# Patient Record
Sex: Male | Born: 1975 | Race: White | Hispanic: No | Marital: Married | Smoking: Never smoker
Health system: Southern US, Community
[De-identification: ages and names within clinical notes are randomized; demographics above are authoritative.]

## PROBLEM LIST (undated history)

## (undated) DIAGNOSIS — M5136 Other intervertebral disc degeneration, lumbar region: Secondary | ICD-10-CM

## (undated) DIAGNOSIS — F419 Anxiety disorder, unspecified: Secondary | ICD-10-CM

## (undated) DIAGNOSIS — N189 Chronic kidney disease, unspecified: Secondary | ICD-10-CM

## (undated) DIAGNOSIS — M1990 Unspecified osteoarthritis, unspecified site: Secondary | ICD-10-CM

## (undated) DIAGNOSIS — G63 Polyneuropathy in diseases classified elsewhere: Secondary | ICD-10-CM

## (undated) DIAGNOSIS — M51369 Other intervertebral disc degeneration, lumbar region without mention of lumbar back pain or lower extremity pain: Secondary | ICD-10-CM

## (undated) DIAGNOSIS — K219 Gastro-esophageal reflux disease without esophagitis: Secondary | ICD-10-CM

## (undated) DIAGNOSIS — J4550 Severe persistent asthma, uncomplicated: Secondary | ICD-10-CM

## (undated) DIAGNOSIS — K649 Unspecified hemorrhoids: Secondary | ICD-10-CM

## (undated) DIAGNOSIS — Z9889 Other specified postprocedural states: Secondary | ICD-10-CM

## (undated) DIAGNOSIS — R7303 Prediabetes: Secondary | ICD-10-CM

## (undated) DIAGNOSIS — J189 Pneumonia, unspecified organism: Secondary | ICD-10-CM

## (undated) DIAGNOSIS — H5010 Unspecified exotropia: Secondary | ICD-10-CM

## (undated) DIAGNOSIS — R42 Dizziness and giddiness: Secondary | ICD-10-CM

## (undated) DIAGNOSIS — K2270 Barrett's esophagus without dysplasia: Secondary | ICD-10-CM

## (undated) DIAGNOSIS — E559 Vitamin D deficiency, unspecified: Secondary | ICD-10-CM

## (undated) DIAGNOSIS — Z8719 Personal history of other diseases of the digestive system: Secondary | ICD-10-CM

## (undated) DIAGNOSIS — R600 Localized edema: Secondary | ICD-10-CM

## (undated) DIAGNOSIS — N200 Calculus of kidney: Secondary | ICD-10-CM

## (undated) DIAGNOSIS — G43909 Migraine, unspecified, not intractable, without status migrainosus: Secondary | ICD-10-CM

## (undated) DIAGNOSIS — E785 Hyperlipidemia, unspecified: Secondary | ICD-10-CM

## (undated) DIAGNOSIS — Z87442 Personal history of urinary calculi: Secondary | ICD-10-CM

## (undated) HISTORY — PX: MYRINGOTOMY WITH TUBE PLACEMENT: SHX5663

## (undated) HISTORY — PX: TONSILLECTOMY: SUR1361

## (undated) HISTORY — DX: Calculus of kidney: N20.0

---

## 2003-09-13 HISTORY — PX: STRABISMUS SURGERY: SHX218

## 2004-05-07 ENCOUNTER — Other Ambulatory Visit: Payer: Self-pay

## 2005-07-05 ENCOUNTER — Ambulatory Visit: Payer: Self-pay

## 2006-04-04 ENCOUNTER — Ambulatory Visit: Payer: Self-pay | Admitting: Internal Medicine

## 2006-08-30 ENCOUNTER — Encounter: Payer: Self-pay | Admitting: Unknown Physician Specialty

## 2006-09-12 ENCOUNTER — Encounter: Payer: Self-pay | Admitting: Unknown Physician Specialty

## 2006-10-13 ENCOUNTER — Encounter: Payer: Self-pay | Admitting: Unknown Physician Specialty

## 2006-11-09 ENCOUNTER — Ambulatory Visit: Payer: Self-pay | Admitting: Psychiatry

## 2006-11-24 ENCOUNTER — Ambulatory Visit: Payer: Self-pay | Admitting: Psychiatry

## 2006-12-20 ENCOUNTER — Ambulatory Visit: Payer: Self-pay | Admitting: Internal Medicine

## 2007-01-27 ENCOUNTER — Ambulatory Visit: Payer: Self-pay | Admitting: Internal Medicine

## 2007-04-27 ENCOUNTER — Ambulatory Visit: Payer: Self-pay | Admitting: Internal Medicine

## 2007-05-07 ENCOUNTER — Encounter: Payer: Self-pay | Admitting: Internal Medicine

## 2007-05-14 ENCOUNTER — Encounter: Payer: Self-pay | Admitting: Internal Medicine

## 2007-05-29 ENCOUNTER — Emergency Department: Payer: Self-pay | Admitting: Emergency Medicine

## 2007-06-05 ENCOUNTER — Ambulatory Visit: Payer: Self-pay | Admitting: Internal Medicine

## 2007-06-13 ENCOUNTER — Encounter: Payer: Self-pay | Admitting: Internal Medicine

## 2007-07-09 ENCOUNTER — Ambulatory Visit: Payer: Self-pay | Admitting: Internal Medicine

## 2008-03-31 ENCOUNTER — Encounter: Payer: Self-pay | Admitting: Internal Medicine

## 2008-04-12 ENCOUNTER — Encounter: Payer: Self-pay | Admitting: Internal Medicine

## 2008-05-13 ENCOUNTER — Encounter: Payer: Self-pay | Admitting: Internal Medicine

## 2008-06-12 ENCOUNTER — Encounter: Payer: Self-pay | Admitting: Internal Medicine

## 2008-10-06 ENCOUNTER — Ambulatory Visit: Payer: Self-pay | Admitting: Physician Assistant

## 2008-12-24 ENCOUNTER — Ambulatory Visit: Payer: Self-pay | Admitting: Internal Medicine

## 2009-04-09 ENCOUNTER — Ambulatory Visit: Payer: Self-pay | Admitting: Urology

## 2010-08-30 ENCOUNTER — Ambulatory Visit: Payer: Self-pay | Admitting: Internal Medicine

## 2010-09-12 HISTORY — PX: URETEROSCOPY WITH HOLMIUM LASER LITHOTRIPSY: SHX6645

## 2010-09-27 ENCOUNTER — Ambulatory Visit: Payer: Self-pay | Admitting: Internal Medicine

## 2010-09-28 ENCOUNTER — Ambulatory Visit: Payer: Self-pay | Admitting: Urology

## 2011-08-05 IMAGING — CT CT STONE STUDY
1 of 2 series · 15 of 32 positions shown, 19 images · non-contrast
Comparison: none

REASON FOR EXAM: CR  [id number]  progressive flank pain eval stone
COMMENTS:

PROCEDURE:     CT  - CT ABDOMEN /PELVIS WO (STONE)  - September 27, 2010 [DATE]
RESULT:     CT abdomen pelvis dated 09/27/2010 comparison is to prior study
dated 12/24/2008.
TECHNIQUE: Helical noncontrasted 3 mm sections were obtained from the lung
bases to the pubic symphysis.

[Series 2: stone · axial · 0.82mm/px · z∈[-14,+462]mm · 15 of 180 slices shown, 19 images]
[im 14/180  soft-tissue]
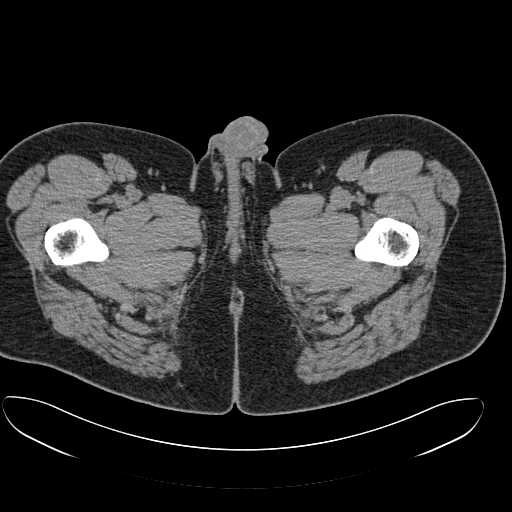
[im 14/180  bone]
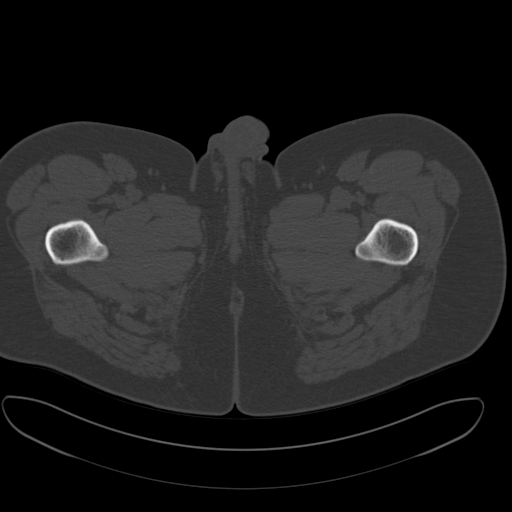
[im 27/180  soft-tissue]
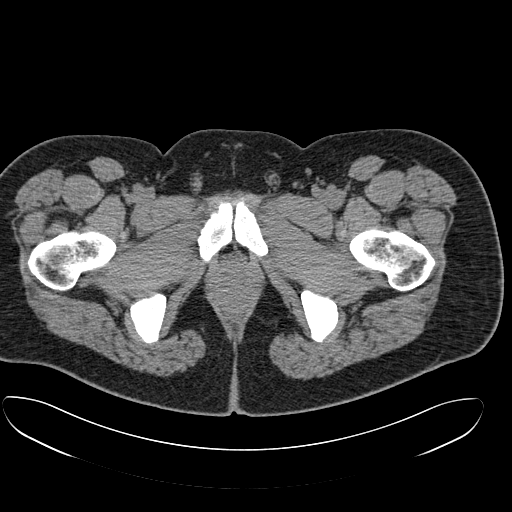
[im 40/180  soft-tissue]
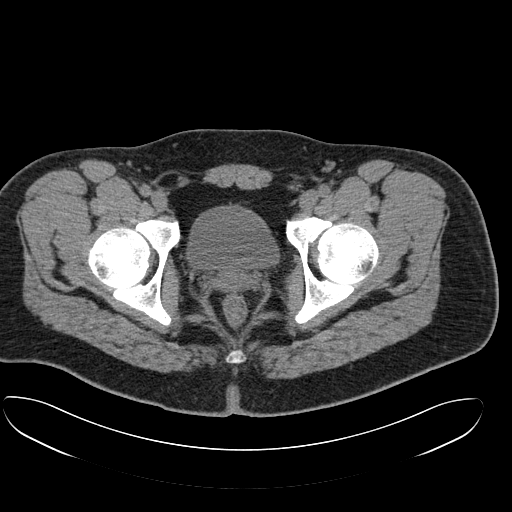
[im 54/180  soft-tissue]
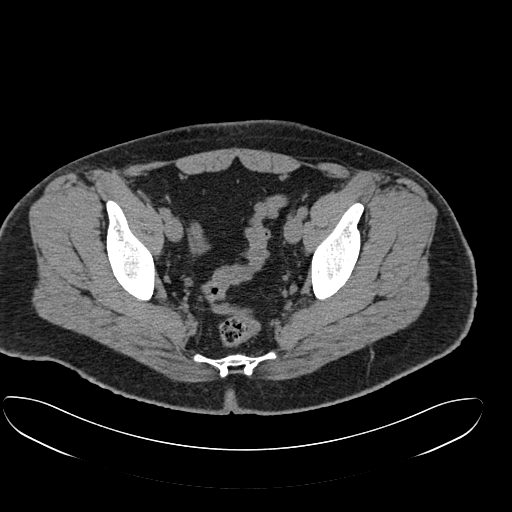
[im 67/180  soft-tissue]
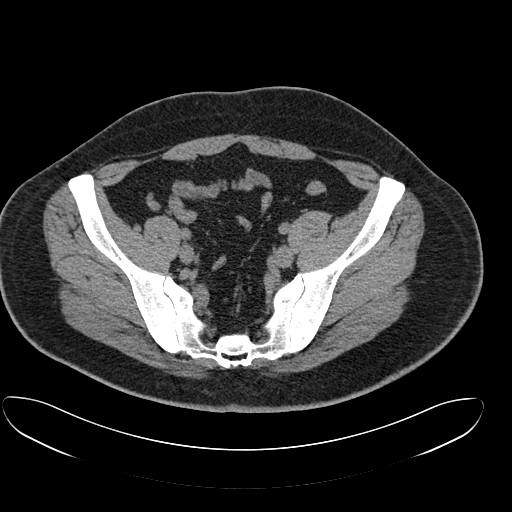
[im 80/180  soft-tissue]
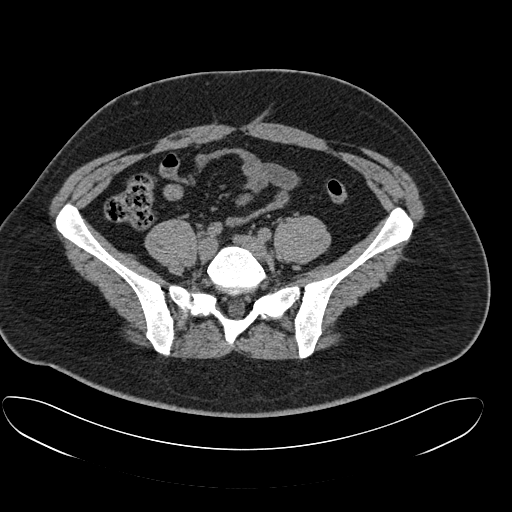
[im 93/180  soft-tissue]
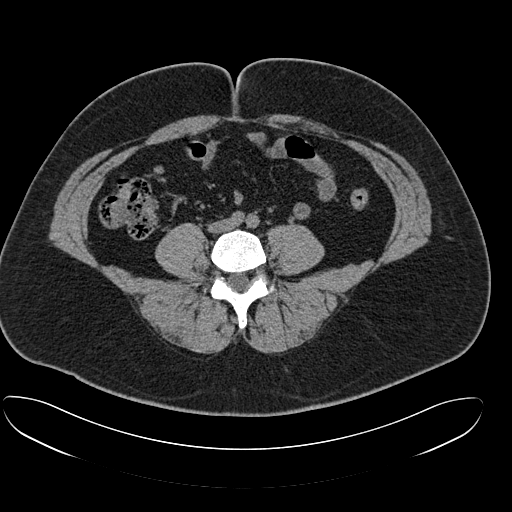
[im 107/180  soft-tissue]
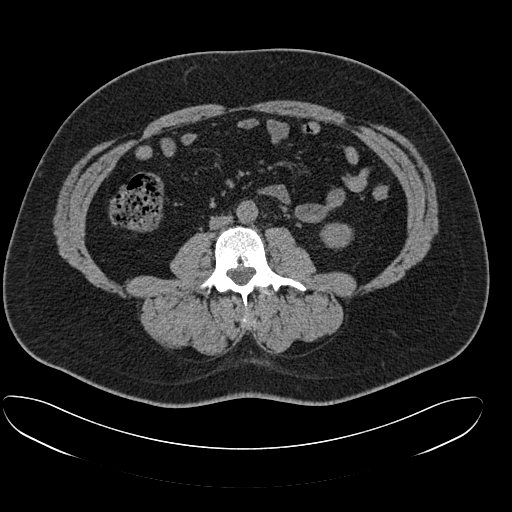
[im 120/180  soft-tissue]
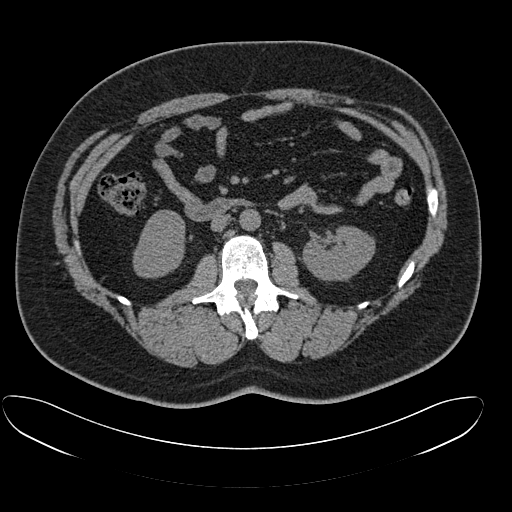
[im 120/180  bone]
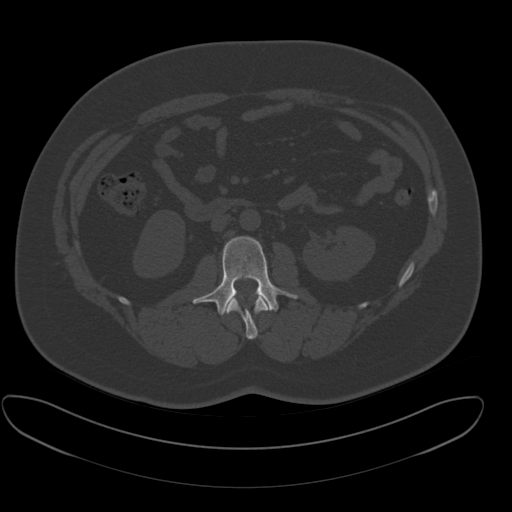
[im 133/180  soft-tissue]
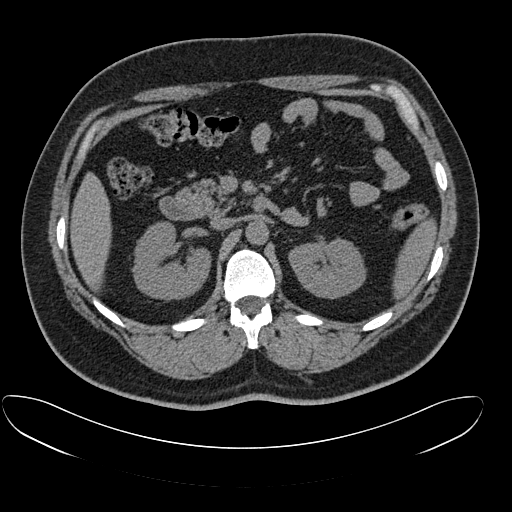
[im 146/180  soft-tissue]
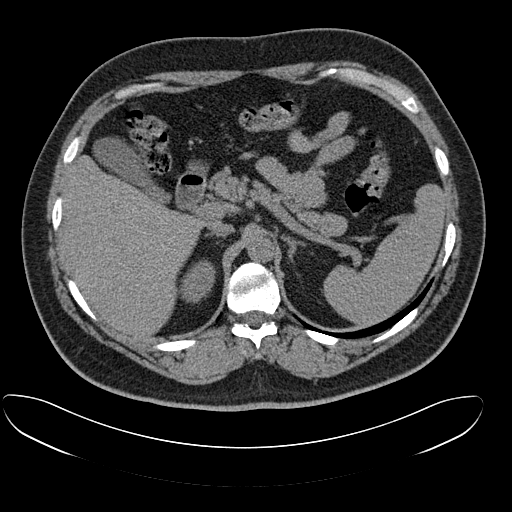
[im 153/180  lung]
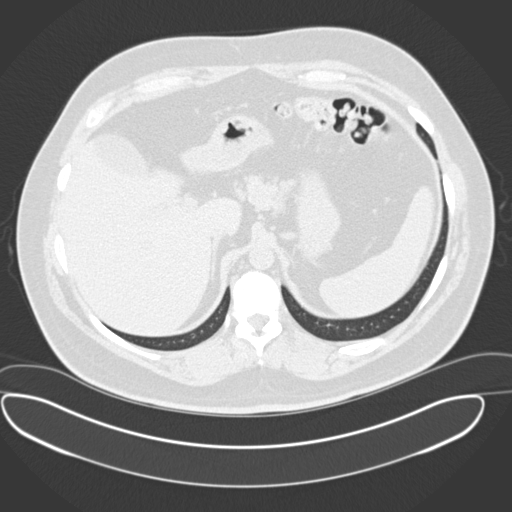
[im 160/180  soft-tissue]
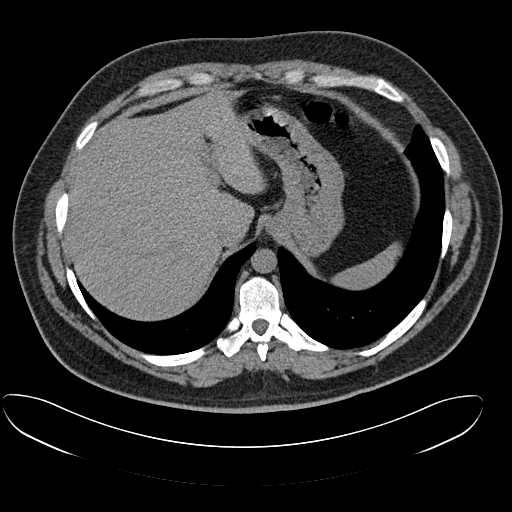
[im 160/180  lung]
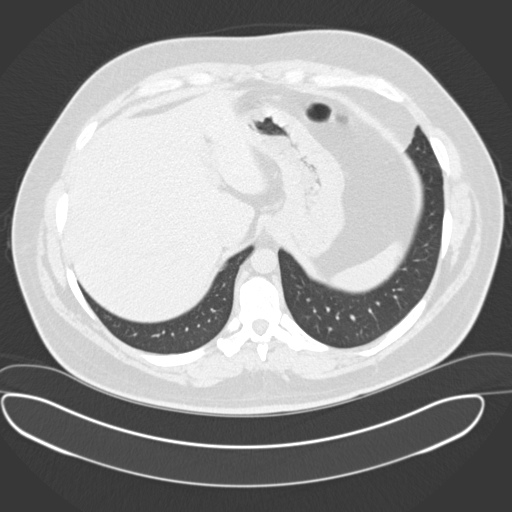
[im 166/180  lung]
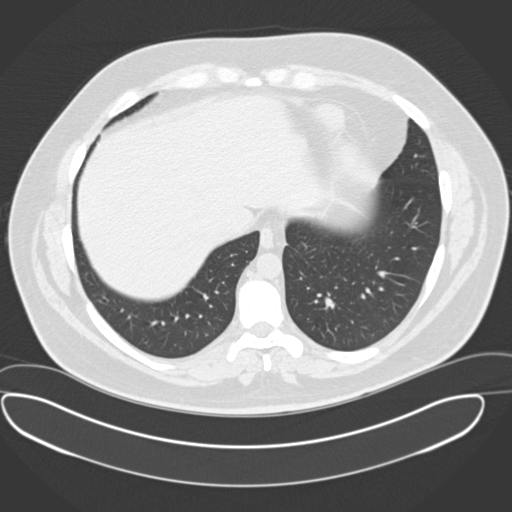
[im 173/180  soft-tissue]
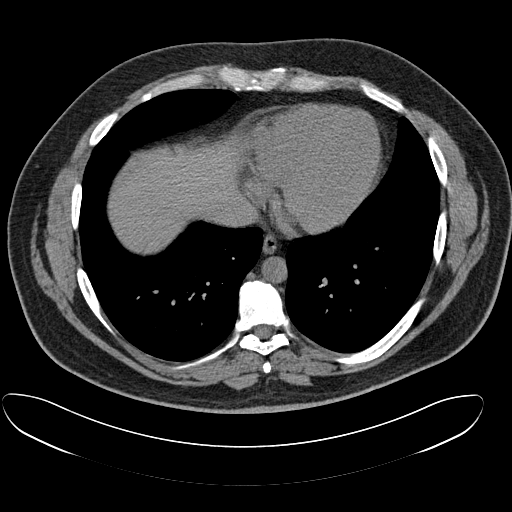
[im 173/180  lung]
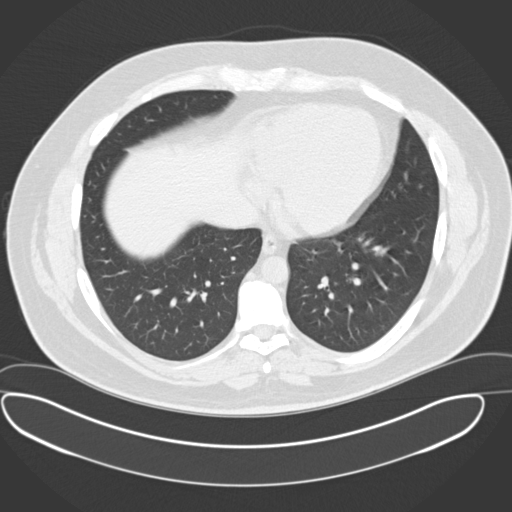

[15 of 32 positions shown; findings below may reference images not displayed]

FINDINGS: Evaluation lung bases demonstrates no gross abnormalities.
Noncontrast evaluation of the liver, spleen, adrenals, pancreas is
unremarkable.

The right kidney demonstrates no evidence of hydronephrosis nor evidence of
hydroureter. Within the distal right ureter at the level of the
vesico-ureteral junction a 3.8 mm calculus is identified. Evaluation of left
kidney demonstrates no evidence of hydronephrosis nor hydroureter. Within
the distal left ureter 3 calculi are appreciated the largest is just
proximal to the vesico-ureteral junction and measures 6.9 mm in diameter.
There is no evidence of obstructive uropathy. The limitations of noncontrast
CT there is no evidence of bowel obstruction, enteritis, colitis,
diverticulitis, nor appendicitis. There is no evidence of abdominal aortic
aneurysm.
IMPRESSION: Bilateral distal ureteral calculi as described above with
no evidence of obstructive uropathy.

## 2011-08-13 ENCOUNTER — Ambulatory Visit: Payer: Self-pay | Admitting: Internal Medicine

## 2013-12-24 ENCOUNTER — Ambulatory Visit: Payer: Self-pay | Admitting: Physical Medicine and Rehabilitation

## 2014-06-11 DIAGNOSIS — G63 Polyneuropathy in diseases classified elsewhere: Secondary | ICD-10-CM | POA: Insufficient documentation

## 2014-06-11 DIAGNOSIS — M5136 Other intervertebral disc degeneration, lumbar region: Secondary | ICD-10-CM | POA: Insufficient documentation

## 2014-07-31 ENCOUNTER — Ambulatory Visit: Payer: Self-pay | Admitting: Unknown Physician Specialty

## 2015-01-05 LAB — SURGICAL PATHOLOGY

## 2016-06-17 DIAGNOSIS — R600 Localized edema: Secondary | ICD-10-CM | POA: Insufficient documentation

## 2016-10-13 ENCOUNTER — Emergency Department: Payer: BLUE CROSS/BLUE SHIELD

## 2016-10-13 ENCOUNTER — Encounter: Payer: Self-pay | Admitting: *Deleted

## 2016-10-13 DIAGNOSIS — N50811 Right testicular pain: Secondary | ICD-10-CM | POA: Diagnosis not present

## 2016-10-13 DIAGNOSIS — R102 Pelvic and perineal pain: Secondary | ICD-10-CM | POA: Insufficient documentation

## 2016-10-13 DIAGNOSIS — N50812 Left testicular pain: Secondary | ICD-10-CM | POA: Diagnosis not present

## 2016-10-13 DIAGNOSIS — N5082 Scrotal pain: Secondary | ICD-10-CM | POA: Diagnosis present

## 2016-10-13 NOTE — ED Triage Notes (Addendum)
Pt had hemmorroids banded in office today by dr Renda Rollswilton smith.  Pt now has testicular pain.   No swelling.  Pt reports he had a bowel movement and pain increased in scrotum.  Small amount rectal bleeding.

## 2016-10-14 ENCOUNTER — Emergency Department
Admission: EM | Admit: 2016-10-14 | Discharge: 2016-10-14 | Disposition: A | Discharge status: Home / Self Care | Payer: BLUE CROSS/BLUE SHIELD | Attending: Emergency Medicine | Admitting: Emergency Medicine

## 2016-10-14 DIAGNOSIS — R102 Pelvic and perineal pain: Secondary | ICD-10-CM

## 2016-10-14 DIAGNOSIS — N50819 Testicular pain, unspecified: Secondary | ICD-10-CM

## 2016-10-14 MED ORDER — ONDANSETRON HCL 4 MG/2ML IJ SOLN
4.0000 mg | Freq: Once | INTRAMUSCULAR | Status: AC
  Administered 2016-10-14: 4 mg via INTRAVENOUS

## 2016-10-14 MED ORDER — LIDOCAINE HCL 2 % EX GEL: 1.0000 "application " | Freq: Once | CUTANEOUS | Status: DC

## 2016-10-14 MED ORDER — MORPHINE SULFATE (PF) 4 MG/ML IV SOLN
6.0000 mg | Freq: Once | INTRAVENOUS | Status: DC
  Filled 2016-10-14: qty 2

## 2016-10-14 MED ORDER — LIDOCAINE VISCOUS 2 % MT SOLN
OROMUCOSAL | Status: AC
  Filled 2016-10-14: qty 15

## 2016-10-14 MED ORDER — MORPHINE SULFATE (PF) 4 MG/ML IV SOLN
6.0000 mg | Freq: Once | INTRAVENOUS | Status: AC
  Administered 2016-10-14: 6 mg via INTRAVENOUS

## 2016-10-14 MED ORDER — ONDANSETRON HCL 4 MG/2ML IJ SOLN
INTRAMUSCULAR | Status: AC
  Filled 2016-10-14: qty 2

## 2016-10-14 MED ORDER — BELLADONNA ALKALOIDS-OPIUM 16.2-60 MG RE SUPP
1.0000 | Freq: Once | RECTAL | Status: AC
  Administered 2016-10-14: 1 via RECTAL
  Filled 2016-10-14: qty 1

## 2016-10-14 MED ORDER — LIDOCAINE HCL (PF) 1 % IJ SOLN
INTRAMUSCULAR | Status: AC
  Filled 2016-10-14: qty 10

## 2016-10-14 MED ORDER — BELLADONNA ALKALOIDS-OPIUM 16.2-60 MG RE SUPP: 1.0000 | Freq: Three times a day (TID) | RECTAL | 0 refills | Status: DC | PRN

## 2016-10-14 NOTE — ED Provider Notes (Signed)
[facility] Emergency Department Provider Note   ____________________________________________   First MD Initiated Contact with Patient 10/14/16 0045     (approximate)  I have reviewed the triage vital signs and the nursing notes.   HISTORY  Chief Complaint Testicle Pain    HPI Patrick Cobb is a 41 y.o. male here for evaluation of pain in the base of scrotum.  Patient reports that he had a bowel movement about 4:30 or 5 PM, thereafter he began experiencing a radiating shooting pain she describes a "nerve" feeling coming from the rectum radiating out into the base of the scrotum. He reports touching the scrotum and testicles does not cause pain, and the testicles penis and groin are not swollen. He has not seen a rash. Shoddy small amount of blood in his bowel movement, but was told by Dr. Katrinka Blazing this is expected.  He is taken 2 hydrocodone tablets with no relief and reports and unremitting nerve pain shooting out from the rectal area across into the base of his scrotum.  No trouble urinating. No blood in his urine. No trouble passing stool. No incontinence.  Denies nausea, vomiting or abdominal pain. No fevers or chills.     No past medical history on file.  There are no active problems to display for this patient.   No past surgical history on file.  Prior to Admission medications   Medication Sig Start Date End Date Taking? Authorizing Provider  opium-belladonna (B&O SUPPRETTES) 16.2-60 MG suppository Place 1 suppository rectally every 8 (eight) hours as needed (rectal pain). 10/14/16   Earline Mayotte, MD    Allergies Patient has no known allergies.  No family history on file.  Social History Social History  Substance Use Topics  . Smoking status: Never Smoker  . Smokeless tobacco: Never Used  . Alcohol use No    Review of Systems Constitutional: No fever/chills Eyes: No visual changes. ENT: No sore throat. Cardiovascular:  Denies chest pain. Respiratory: Denies shortness of breath. Gastrointestinal: No abdominal pain.  No nausea, no vomiting.   Genitourinary: Negative for dysuria.See history of present illness Musculoskeletal: Negative for back pain. Skin: Negative for rash. Neurological: Negative for headaches, focal weakness or numbness.  10-point ROS otherwise negative.  ____________________________________________   PHYSICAL EXAM:  VITAL SIGNS: ED Triage Vitals  Enc Vitals Group     BP 10/13/16 2028 129/74     Pulse Rate 10/13/16 2028 75     Resp 10/13/16 2028 20     Temp 10/13/16 2028 98.6 F (37 C)     Temp Source 10/13/16 2028 Oral     SpO2 10/13/16 2028 99 %     Weight 10/13/16 2029 265 lb (120.2 kg)     Height 10/13/16 2029 6\' 1"  (1.854 m)     Head Circumference --      Peak Flow --      Pain Score 10/13/16 2029 7     Pain Loc --      Pain Edu? --      Excl. in GC? --     Constitutional: Alert and oriented. Appears uncomfortable, somewhat rolling in the bed difficult for him to get relief of discomfort. Eyes: Conjunctivae are normal. PERRL. EOMI. Head: Atraumatic. Nose: No congestion/rhinnorhea. Mouth/Throat: Mucous membranes are moist.   Cardiovascular: Normal rate, regular rhythm. Grossly normal heart sounds.  Good peripheral circulation. Respiratory: Normal respiratory effort.   Gastrointestinal: Soft and nontender. No distention. No abdominal bruits. No CVA tenderness.  Genitourinary: The scrotum, penis and perineum appear normal. There is no redness or rash. No swelling. Palpation of the scrotum and testicles do not reproduce the discomfort the patient experiences. External inspection of the rectum demonstrates no hemorrhoids or bleeding. A digital rectal examination was not performed. Musculoskeletal: No lower extremity tenderness nor edema.   Neurologic:  Normal speech and language. No gross focal neurologic deficits are appreciated. Skin:  Skin is warm, dry and intact. No  rash noted. Psychiatric: Mood and affect are normal. Speech and behavior are normal.  ____________________________________________   LABS (all labs ordered are listed, but only abnormal results are displayed)  Labs Reviewed - No data to display ____________________________________________  EKG   ____________________________________________  RADIOLOGY  ____________________________________________   PROCEDURES  Procedure(s) performed: None  Procedures  Critical Care performed: No  ____________________________________________   INITIAL IMPRESSION / ASSESSMENT AND PLAN / ED COURSE  Pertinent labs & imaging results that were available during my care of the patient were reviewed by me and considered in my medical decision making (see chart for details).  Patient presents with neuropathic type pain across the perineum after hemorrhoid banding. His scrotal examination and ultrasound are very reassuring. There is no perineal lesion noted. Patient was seen by Dr. Doristine CounterBurnett of surgery in the emergency room, he evaluated and examined internally and advises discharge with close follow-up.  Patient provided a prescription by Dr. Doristine CounterBurnett, patient will be following up closely with Dr. Katrinka BlazingSmith.  Return precautions and treatment recommendations and follow-up discussed with the patient who is agreeable with the plan.       ____________________________________________   FINAL CLINICAL IMPRESSION(S) / ED DIAGNOSES  Final diagnoses:  Male perineal pain      NEW MEDICATIONS STARTED DURING THIS VISIT:  Current Discharge Medication List    START taking these medications   Details  opium-belladonna (B&O SUPPRETTES) 16.2-60 MG suppository Place 1 suppository rectally every 8 (eight) hours as needed (rectal pain). Qty: 4 suppository, Refills: 0         Note:  This document was prepared using Dragon voice recognition software and may include unintentional dictation errors.      Sharyn CreamerMark Quale, MD 10/14/16 [phone]

## 2016-10-14 NOTE — ED Notes (Signed)

## 2016-10-14 NOTE — Discharge Instructions (Signed)
Follow-up closely with Dr. Katrinka BlazingSmith.  No driving this morning as you were given a dose of morphine.  Return to the emergency room right away if you notice a large amount of blood in stool, feel weak or dizzy,  experiencing abdominal pain, have a fever, noticed swelling redness or severe pain in your testicles or penis, or other new concerns arise.

## 2016-10-14 NOTE — Consult Note (Signed)
Reason for Consult:Rectal pain post hemorrhoid banding.  Referring Physician: Sharyn CreamerMark Quale, MD  Patrick Cobb is an 41 y.o. male.  HPI: Hemorrhoid banding at 4 PM yesterday. BM about 5, developed pain in peri-anal area extending into testicles.    No past medical history on file.  No past surgical history on file.  No family history on file.  Social History:  reports that he has never smoked. He has never used smokeless tobacco. He reports that he does not drink alcohol. His drug history is not on file.  Allergies: No Known Allergies  Medications: I have reviewed the patient's current medications.  No results found for this or any previous visit (from the past 48 hour(s)).  [place] Scrotum  Result Date: 10/13/2016 CLINICAL DATA:  Bilateral testicular pain after hemorrhoid banding today, worsened after bowel movement. EXAM: SCROTAL ULTRASOUND DOPPLER ULTRASOUND OF THE TESTICLES TECHNIQUE: Complete ultrasound examination of the testicles, epididymis, and other scrotal structures was performed. Color and spectral Doppler ultrasound were also utilized to evaluate blood flow to the testicles. COMPARISON:  None. FINDINGS: Right testicle Measurements: 4.6 x 2.8 x 3.2 cm. No mass or microlithiasis visualized. Left testicle Measurements: 4.5 x 2.7 x 3.2 cm. No mass or microlithiasis visualized. Right epididymis:  2.8 mm epididymal head cyst. Left epididymis:  Normal in size and appearance. Hydrocele:  None visualized. Varicocele: Small varicoceles bilaterally, confirmed with Doppler during Valsalva. Pulsed Doppler interrogation of both testes demonstrates normal low resistance arterial and venous waveforms bilaterally. IMPRESSION: No testicular mass or torsion. No abnormal scrotal fluid collections. Electronically Signed   By: Ellery Plunkaniel R Mitchell M.D.   On: 10/13/2016 21:31   [place] Art/ven Flow Abd Pelv Doppler  Result Date: 10/13/2016 CLINICAL DATA:  Bilateral testicular pain after hemorrhoid banding  today, worsened after bowel movement. EXAM: SCROTAL ULTRASOUND DOPPLER ULTRASOUND OF THE TESTICLES TECHNIQUE: Complete ultrasound examination of the testicles, epididymis, and other scrotal structures was performed. Color and spectral Doppler ultrasound were also utilized to evaluate blood flow to the testicles. COMPARISON:  None. FINDINGS: Right testicle Measurements: 4.6 x 2.8 x 3.2 cm. No mass or microlithiasis visualized. Left testicle Measurements: 4.5 x 2.7 x 3.2 cm. No mass or microlithiasis visualized. Right epididymis:  2.8 mm epididymal head cyst. Left epididymis:  Normal in size and appearance. Hydrocele:  None visualized. Varicocele: Small varicoceles bilaterally, confirmed with Doppler during Valsalva. Pulsed Doppler interrogation of both testes demonstrates normal low resistance arterial and venous waveforms bilaterally. IMPRESSION: No testicular mass or torsion. No abnormal scrotal fluid collections. Electronically Signed   By: Ellery Plunkaniel R Mitchell M.D.   On: 10/13/2016 21:31    ROS Blood pressure 128/67, pulse 65, temperature 98 F (36.7 C), temperature source Oral, resp. rate 18, height 6\' 1"  (1.854 m), weight 265 lb (120.2 kg), SpO2 98 %. Physical Exam  Genitourinary:     Genitourinary Comments: Anoscopy after 15 cc 2 % xylocaine jelly: Posterior bands in place, anterior band had sloughed. No hematoma.    Assessment/Plan: Symptomatic RX, B&O suppository.  Patrick Cobb, Patrick Cobb 10/14/2016, 3:28 AM

## 2016-10-14 NOTE — Final Progress Note (Signed)
  Pain post hemorrhoid banding. Normal rectal exam. Anoscopy shows anterior band has sloughed, < 1 cm hemorrhoidal masses posterior. No spasm, evidence of abscess/ hematoma.

## 2016-10-14 NOTE — ED Notes (Signed)
Pt states he had a hemrroids repair surgery around 1500, and he was "sore" after that, but the pain really got bad around 1700; pt states he had a bowel movement around 1730 and then the pain was really really bad.

## 2017-07-09 DIAGNOSIS — G8929 Other chronic pain: Secondary | ICD-10-CM | POA: Insufficient documentation

## 2017-09-12 HISTORY — PX: COLONOSCOPY WITH ESOPHAGOGASTRODUODENOSCOPY (EGD): SHX5779

## 2017-09-19 ENCOUNTER — Other Ambulatory Visit: Payer: Self-pay | Admitting: Sports Medicine

## 2017-09-19 DIAGNOSIS — M7541 Impingement syndrome of right shoulder: Secondary | ICD-10-CM

## 2017-09-29 ENCOUNTER — Ambulatory Visit
Admission: RE | Admit: 2017-09-29 | Discharge: 2017-09-29 | Disposition: A | Discharge status: Home / Self Care | Payer: BLUE CROSS/BLUE SHIELD | Source: Ambulatory Visit | Attending: Sports Medicine | Admitting: Sports Medicine

## 2017-09-29 DIAGNOSIS — M7541 Impingement syndrome of right shoulder: Secondary | ICD-10-CM | POA: Diagnosis not present

## 2017-09-29 DIAGNOSIS — M7551 Bursitis of right shoulder: Secondary | ICD-10-CM | POA: Diagnosis not present

## 2017-10-31 ENCOUNTER — Encounter
Admission: RE | Disposition: A | Discharge status: Home / Self Care | Payer: Self-pay | Source: Ambulatory Visit | Attending: Orthopedic Surgery

## 2017-10-31 ENCOUNTER — Ambulatory Visit: Payer: BLUE CROSS/BLUE SHIELD | Admitting: Anesthesiology

## 2017-10-31 ENCOUNTER — Ambulatory Visit
Admission: RE | Admit: 2017-10-31 | Discharge: 2017-10-31 | Disposition: A | Discharge status: Home / Self Care | Payer: BLUE CROSS/BLUE SHIELD | Source: Ambulatory Visit | Attending: Orthopedic Surgery | Admitting: Orthopedic Surgery

## 2017-10-31 DIAGNOSIS — K219 Gastro-esophageal reflux disease without esophagitis: Secondary | ICD-10-CM | POA: Insufficient documentation

## 2017-10-31 DIAGNOSIS — G6289 Other specified polyneuropathies: Secondary | ICD-10-CM | POA: Diagnosis not present

## 2017-10-31 DIAGNOSIS — E669 Obesity, unspecified: Secondary | ICD-10-CM | POA: Diagnosis not present

## 2017-10-31 DIAGNOSIS — F419 Anxiety disorder, unspecified: Secondary | ICD-10-CM | POA: Insufficient documentation

## 2017-10-31 DIAGNOSIS — M25811 Other specified joint disorders, right shoulder: Secondary | ICD-10-CM | POA: Diagnosis present

## 2017-10-31 DIAGNOSIS — E559 Vitamin D deficiency, unspecified: Secondary | ICD-10-CM | POA: Insufficient documentation

## 2017-10-31 DIAGNOSIS — Z79899 Other long term (current) drug therapy: Secondary | ICD-10-CM | POA: Insufficient documentation

## 2017-10-31 DIAGNOSIS — Z6835 Body mass index (BMI) 35.0-35.9, adult: Secondary | ICD-10-CM | POA: Insufficient documentation

## 2017-10-31 DIAGNOSIS — M7541 Impingement syndrome of right shoulder: Secondary | ICD-10-CM | POA: Insufficient documentation

## 2017-10-31 DIAGNOSIS — M19011 Primary osteoarthritis, right shoulder: Secondary | ICD-10-CM | POA: Diagnosis not present

## 2017-10-31 DIAGNOSIS — Z79891 Long term (current) use of opiate analgesic: Secondary | ICD-10-CM | POA: Insufficient documentation

## 2017-10-31 DIAGNOSIS — M7062 Trochanteric bursitis, left hip: Secondary | ICD-10-CM

## 2017-10-31 HISTORY — DX: Gastro-esophageal reflux disease without esophagitis: K21.9

## 2017-10-31 HISTORY — PX: SHOULDER ARTHROSCOPY WITH DISTAL CLAVICLE RESECTION: SHX5675

## 2017-10-31 HISTORY — DX: Unspecified osteoarthritis, unspecified site: M19.90

## 2017-10-31 HISTORY — DX: Chronic kidney disease, unspecified: N18.9

## 2017-10-31 SURGERY — SHOULDER ARTHROSCOPY WITH DISTAL CLAVICLE RESECTION
Anesthesia: Regional | Site: Shoulder | Laterality: Right | Wound class: Clean

## 2017-10-31 MED ORDER — CEFAZOLIN SODIUM 1 G IJ SOLR
3000.0000 mg | Freq: Once | INTRAMUSCULAR | Status: AC
  Administered 2017-10-31: 3000 mg via INTRAVENOUS

## 2017-10-31 MED ORDER — FENTANYL CITRATE (PF) 100 MCG/2ML IJ SOLN
INTRAMUSCULAR | Status: DC | PRN
  Administered 2017-10-31: 50 ug via INTRAVENOUS

## 2017-10-31 MED ORDER — GLYCOPYRROLATE 0.2 MG/ML IJ SOLN
INTRAMUSCULAR | Status: DC | PRN
  Administered 2017-10-31: 0.1 mg via INTRAVENOUS

## 2017-10-31 MED ORDER — HYDROCODONE-ACETAMINOPHEN 5-325 MG PO TABS: 1.0000 | ORAL_TABLET | ORAL | 0 refills | Status: DC | PRN

## 2017-10-31 MED ORDER — LACTATED RINGERS IV SOLN: INTRAVENOUS | Status: DC

## 2017-10-31 MED ORDER — LIDOCAINE HCL (CARDIAC) 20 MG/ML IV SOLN
INTRAVENOUS | Status: DC | PRN
  Administered 2017-10-31: 40 mg via INTRATRACHEAL

## 2017-10-31 MED ORDER — OXYCODONE HCL 5 MG PO TABS: 5.0000 mg | ORAL_TABLET | ORAL | 0 refills | Status: DC | PRN

## 2017-10-31 MED ORDER — ACETAMINOPHEN 10 MG/ML IV SOLN: 1000.0000 mg | Freq: Once | INTRAVENOUS | Status: DC | PRN

## 2017-10-31 MED ORDER — LIDOCAINE-EPINEPHRINE 1 %-1:100000 IJ SOLN
INTRAMUSCULAR | Status: DC | PRN
  Administered 2017-10-31: 2.5 mL

## 2017-10-31 MED ORDER — LACTATED RINGERS IV SOLN
INTRAVENOUS | Status: DC | PRN
  Administered 2017-10-31: 6 mL
  Administered 2017-10-31 (×2): 2 mL

## 2017-10-31 MED ORDER — ONDANSETRON HCL 4 MG/2ML IJ SOLN
INTRAMUSCULAR | Status: DC | PRN
  Administered 2017-10-31: 4 mg via INTRAVENOUS

## 2017-10-31 MED ORDER — BUPIVACAINE HCL (PF) 0.5 % IJ SOLN
INTRAMUSCULAR | Status: DC | PRN
  Administered 2017-10-31: 2.5 mL

## 2017-10-31 MED ORDER — ASPIRIN EC 325 MG PO TBEC: 325.0000 mg | DELAYED_RELEASE_TABLET | Freq: Every day | ORAL | 0 refills | Status: AC

## 2017-10-31 MED ORDER — LACTATED RINGERS IV SOLN
INTRAVENOUS | Status: DC
  Administered 2017-10-31 (×2): via INTRAVENOUS

## 2017-10-31 MED ORDER — PROPOFOL 10 MG/ML IV BOLUS
INTRAVENOUS | Status: DC | PRN
  Administered 2017-10-31: 170 mg via INTRAVENOUS

## 2017-10-31 MED ORDER — IBUPROFEN 800 MG PO TABS: 800.0000 mg | ORAL_TABLET | Freq: Three times a day (TID) | ORAL | 0 refills | Status: AC

## 2017-10-31 MED ORDER — ONDANSETRON 4 MG PO TBDP: 4.0000 mg | ORAL_TABLET | Freq: Three times a day (TID) | ORAL | 0 refills | Status: DC | PRN

## 2017-10-31 MED ORDER — ONDANSETRON HCL 4 MG/2ML IJ SOLN: 4.0000 mg | Freq: Once | INTRAMUSCULAR | Status: DC | PRN

## 2017-10-31 MED ORDER — ROPIVACAINE HCL 5 MG/ML IJ SOLN
INTRAMUSCULAR | Status: DC | PRN
  Administered 2017-10-31: 40 mL

## 2017-10-31 MED ORDER — OXYCODONE HCL 5 MG/5ML PO SOLN: 5.0000 mg | Freq: Once | ORAL | Status: DC | PRN

## 2017-10-31 MED ORDER — DEXAMETHASONE SODIUM PHOSPHATE 4 MG/ML IJ SOLN
INTRAMUSCULAR | Status: DC | PRN
  Administered 2017-10-31: 4 mg via INTRAVENOUS

## 2017-10-31 MED ORDER — MIDAZOLAM HCL 5 MG/5ML IJ SOLN
INTRAMUSCULAR | Status: DC | PRN
  Administered 2017-10-31: 2 mg via INTRAVENOUS

## 2017-10-31 MED ORDER — ACETAMINOPHEN 500 MG PO TABS: 1000.0000 mg | ORAL_TABLET | Freq: Three times a day (TID) | ORAL | 2 refills | Status: DC

## 2017-10-31 MED ORDER — PROPOFOL 500 MG/50ML IV EMUL
INTRAVENOUS | Status: DC | PRN
  Administered 2017-10-31: 75 ug/kg/min via INTRAVENOUS

## 2017-10-31 MED ORDER — FENTANYL CITRATE (PF) 100 MCG/2ML IJ SOLN: 25.0000 ug | INTRAMUSCULAR | Status: DC | PRN

## 2017-10-31 MED ORDER — OXYCODONE HCL 5 MG PO TABS: 5.0000 mg | ORAL_TABLET | Freq: Once | ORAL | Status: DC | PRN

## 2017-10-31 SURGICAL SUPPLY — 73 items
ADAPTER IRRIG TUBE 2 SPIKE SOL (ADAPTER) ×6 IMPLANT
BIT DRILL RIGD1.8MM FBRTK STRL (DRILL) IMPLANT
BUR BR 5.5 12 FLUTE (BURR) ×3 IMPLANT
BUR RADIUS 4.0X18.5 (BURR) ×3 IMPLANT
BUR RADIUS 5.5 (BURR) IMPLANT
CANNULA 5.75X7 CRYSTAL CLEAR (CANNULA) IMPLANT
CANNULA TWIST IN 8.25X9CM (CANNULA) ×3 IMPLANT
CHLORAPREP W/TINT 26ML (MISCELLANEOUS) ×3 IMPLANT
CLOSURE WOUND 1/2 X4 (GAUZE/BANDAGES/DRESSINGS)
COOLER POLAR GLACIER W/PUMP (MISCELLANEOUS) ×3 IMPLANT
COVER LIGHT HANDLE UNIVERSAL (MISCELLANEOUS) ×6 IMPLANT
DERMABOND ADVANCED (GAUZE/BANDAGES/DRESSINGS)
DERMABOND ADVANCED .7 DNX12 (GAUZE/BANDAGES/DRESSINGS) IMPLANT
DRAPE IMP U-DRAPE 54X76 (DRAPES) ×6 IMPLANT
DRAPE INCISE IOBAN 66X45 STRL (DRAPES) ×3 IMPLANT
DRAPE SHEET LG 3/4 BI-LAMINATE (DRAPES) ×3 IMPLANT
DRILL RIGID 1.8MM FBRTK STRL (DRILL)
DRSG TEGADERM 4X4.75 (GAUZE/BANDAGES/DRESSINGS) ×12 IMPLANT
ELECT REM PT RETURN 9FT ADLT (ELECTROSURGICAL) ×3
ELECTRODE REM PT RTRN 9FT ADLT (ELECTROSURGICAL) ×1 IMPLANT
GAUZE PETRO XEROFOAM 1X8 (MISCELLANEOUS) ×3 IMPLANT
GAUZE SPONGE 2X2 8PLY NS LF (MISCELLANEOUS) ×4 IMPLANT
GAUZE SPONGE 2X2 NS 8PLY (MISCELLANEOUS) ×8
GAUZE SPONGE 4X4 12PLY STRL (GAUZE/BANDAGES/DRESSINGS) ×3 IMPLANT
GLOVE BIOGEL PI IND STRL 8 (GLOVE) ×3 IMPLANT
GLOVE BIOGEL PI INDICATOR 8 (GLOVE) ×6
GOWN STRL REIN 2XL XLG LVL4 (GOWN DISPOSABLE) ×3 IMPLANT
GOWN STRL REUS W/ TWL LRG LVL3 (GOWN DISPOSABLE) ×2 IMPLANT
GOWN STRL REUS W/TWL LRG LVL3 (GOWN DISPOSABLE) ×4
GOWN STRL REUS W/TWL LRG LVL4 (GOWN DISPOSABLE) IMPLANT
IV LACTATED RINGER IRRG 3000ML (IV SOLUTION) ×20
IV LR IRRIG 3000ML ARTHROMATIC (IV SOLUTION) ×10 IMPLANT
KIT SPEAR STR 1.6MM DRILL (MISCELLANEOUS) IMPLANT
KIT SUTURETAK 3.0 INSERT PERC (KITS) IMPLANT
KIT TURNOVER KIT A (KITS) ×3 IMPLANT
MANIFOLD 4PT FOR NEPTUNE1 (MISCELLANEOUS) ×3 IMPLANT
MASK FACE SPIDER DISP (MASK) ×3 IMPLANT
MAT ABSORB  FLUID 56X50 GRAY (MISCELLANEOUS) ×4
MAT ABSORB FLUID 56X50 GRAY (MISCELLANEOUS) ×2 IMPLANT
NDL SAFETY ECLIPSE 18X1.5 (NEEDLE) IMPLANT
NEEDLE HYPO 18GX1.5 SHARP (NEEDLE)
NEEDLE HYPO 22GX1.5 SAFETY (NEEDLE) ×3 IMPLANT
NEEDLE MAYO CATGUT SZ 1.5 (NEEDLE)
NEEDLE MAYO CATGUT SZ 2 (NEEDLE) IMPLANT
PACK ARTHROSCOPY SHOULDER (MISCELLANEOUS) ×3 IMPLANT
PAD ABD DERMACEA PRESS 5X9 (GAUZE/BANDAGES/DRESSINGS) IMPLANT
PAD WRAPON POLAR SHDR UNIV (MISCELLANEOUS) IMPLANT
SET TUBE SUCT SHAVER OUTFL 24K (TUBING) ×3 IMPLANT
SET TUBE TIP INTRA-ARTICULAR (MISCELLANEOUS) ×3 IMPLANT
SLEEVE COMPRESS THIGH MED VP53 (MISCELLANEOUS) ×2 IMPLANT
SLING ULTRA II M (MISCELLANEOUS) IMPLANT
STRAP BODY AND KNEE 60X3 (MISCELLANEOUS) IMPLANT
STRIP CLOSURE SKIN 1/2X4 (GAUZE/BANDAGES/DRESSINGS) IMPLANT
SUT ETHILON 3-0 FS-10 30 BLK (SUTURE) ×3
SUT ETHILON 4-0 (SUTURE)
SUT ETHILON 4-0 FS2 18XMFL BLK (SUTURE)
SUT LASSO 90 DEG SD STR (SUTURE) IMPLANT
SUT MNCRL 4-0 (SUTURE)
SUT MNCRL 4-0 27XMFL (SUTURE)
SUT VIC AB 3-0 SH 27 (SUTURE)
SUT VIC AB 3-0 SH 27X BRD (SUTURE) IMPLANT
SUTURE EHLN 3-0 FS-10 30 BLK (SUTURE) ×1 IMPLANT
SUTURE ETHLN 4-0 FS2 18XMF BLK (SUTURE) IMPLANT
SUTURE MNCRL 4-0 27XMF (SUTURE) IMPLANT
SYR 10ML LL (SYRINGE) ×3 IMPLANT
TAPE MICROFOAM 4IN (TAPE) ×3 IMPLANT
THIGH COMPRESSION MED VP530M (MISCELLANEOUS) ×1
TUBING ARTHRO INFLOW-ONLY STRL (TUBING) ×3 IMPLANT
TUBING CONNECTING 10 (TUBING) ×2 IMPLANT
TUBING CONNECTING 10' (TUBING) ×1
WAND HAND CNTRL MULTIVAC 90 (MISCELLANEOUS) IMPLANT
WAND MEGAVAC 90 (MISCELLANEOUS) ×3 IMPLANT
WRAPON POLAR PAD SHDR UNIV (MISCELLANEOUS)

## 2017-10-31 NOTE — Anesthesia Preprocedure Evaluation (Addendum)
Anesthesia Evaluation  Patient identified by MRN, date of birth, ID band Patient awake    Reviewed: Allergy & Precautions, NPO status , Patient's Chart, lab work & pertinent test results  History of Anesthesia Complications Negative for: history of anesthetic complications  Airway Mallampati: II  TM Distance: >3 FB Neck ROM: Full    Dental  (+)    Pulmonary  Snoring    Pulmonary exam normal breath sounds clear to auscultation       Cardiovascular Exercise Tolerance: Good negative cardio ROS Normal cardiovascular exam Rhythm:Regular Rate:Normal     Neuro/Psych negative neurological ROS     GI/Hepatic GERD  Medicated and Controlled,  Endo/Other  negative endocrine ROS  Renal/GU Renal disease (hx nephrolithiasis)     Musculoskeletal  (+) Arthritis , Osteoarthritis,    Abdominal   Peds  Hematology negative hematology ROS (+)   Anesthesia Other Findings Obesity   Reproductive/Obstetrics                             Anesthesia Physical Anesthesia Plan  ASA: II  Anesthesia Plan: General and Regional   Post-op Pain Management:  Regional for Post-op pain and GA combined w/ Regional for post-op pain   Induction: Intravenous  PONV Risk Score and Plan: 2 and Dexamethasone and Ondansetron  Airway Management Planned: LMA  Additional Equipment:   Intra-op Plan:   Post-operative Plan: Extubation in OR  Informed Consent: I have reviewed the patients History and Physical, chart, labs and discussed the procedure including the risks, benefits and alternatives for the proposed anesthesia with the patient or authorized representative who has indicated his/her understanding and acceptance.     Plan Discussed with: CRNA  Anesthesia Plan Comments: (Interscalene nerve block, GA with LMA.)      Anesthesia Quick Evaluation

## 2017-10-31 NOTE — Anesthesia Procedure Notes (Signed)
Procedure Name: LMA Insertion Date/Time: 10/31/2017 10:14 AM Performed by: Jimmy PicketAmyot, Ellyssa Zagal, CRNA Pre-anesthesia Checklist: Patient identified, Emergency Drugs available, Suction available, Timeout performed and Patient being monitored Patient Re-evaluated:Patient Re-evaluated prior to induction Oxygen Delivery Method: Circle system utilized Preoxygenation: Pre-oxygenation with 100% oxygen Induction Type: IV induction LMA: LMA inserted LMA Size: 4.0 Number of attempts: 1 Placement Confirmation: positive ETCO2 and breath sounds checked- equal and bilateral Tube secured with: Tape

## 2017-10-31 NOTE — Anesthesia Postprocedure Evaluation (Signed)
Anesthesia Post Note  Patient: Ocie BobWilliam D Dunivan  Procedure(s) Performed: SHOULDER ARTHROSCOPY WITH DISTAL CLAVICLE EXCISION, SUBACROMIAL DECOMPRESSION, EXTENSIVE DEBRIDEMENT (Right Shoulder)  Patient location during evaluation: PACU Anesthesia Type: Regional Level of consciousness: awake and alert, oriented and patient cooperative Pain management: pain level controlled Vital Signs Assessment: post-procedure vital signs reviewed and stable Respiratory status: spontaneous breathing, nonlabored ventilation and respiratory function stable Cardiovascular status: blood pressure returned to baseline and stable Postop Assessment: adequate PO intake Anesthetic complications: no    Reed BreechAndrea Chance Munter

## 2017-10-31 NOTE — H&P (Signed)
Paper H&P to be scanned into permanent record. H&P reviewed. No significant changes noted.  

## 2017-10-31 NOTE — Anesthesia Procedure Notes (Signed)
Anesthesia Regional Block: Interscalene brachial plexus block   Pre-Anesthetic Checklist: ,, timeout performed, Correct Patient, Correct Site, Correct Laterality, Correct Procedure, Correct Position, site marked, Risks and benefits discussed,  Surgical consent,  Pre-op evaluation,  At surgeon's request and post-op pain management  Laterality: Right  Prep: chloraprep       Needles:  Injection technique: Single-shot  Needle Type: Stimiplex     Needle Length: 5cm  Needle Gauge: 21     Additional Needles:   Procedures:,,,, ultrasound used (permanent image in chart),,,,  Narrative:  Start time: 10/31/2017 9:08 AM End time: 10/31/2017 9:18 AM Injection made incrementally with aspirations every 5 mL.  Performed by: Personally  Anesthesiologist: Reed BreechMazzoni, Hydee Fleece, MD  Additional Notes: Functioning IV was confirmed and monitors applied. Ultrasound guidance: relevant anatomy identified, needle position confirmed, local anesthetic spread visualized around nerve(s)., vascular puncture avoided.  Image printed for medical record.  Negative aspiration and no paresthesias; incremental administration of local anesthetic. The patient tolerated the procedure well. Vitals signes recorded in RN notes.

## 2017-10-31 NOTE — Transfer of Care (Signed)
Immediate Anesthesia Transfer of Care Note  Patient: Patrick Cobb  Procedure(s) Performed: SHOULDER ARTHROSCOPY WITH DISTAL CLAVICLE EXCISION, SUBACROMIAL DECOMPRESSION, EXTENSIVE DEBRIDEMENT (Right Shoulder)  Patient Location: PACU  Anesthesia Type: General, Regional  Level of Consciousness: awake, alert  and patient cooperative  Airway and Oxygen Therapy: Patient Spontanous Breathing and Patient connected to supplemental oxygen  Post-op Assessment: Post-op Vital signs reviewed, Patient's Cardiovascular Status Stable, Respiratory Function Stable, Patent Airway and No signs of Nausea or vomiting  Post-op Vital Signs: Reviewed and stable  Complications: No apparent anesthesia complications

## 2017-10-31 NOTE — Progress Notes (Signed)
Assisted Reed BreechAndrea Mazzoni, ANMD with right, ultrasound guided, interscalene  block. Side rails up, monitors on throughout procedure. See vital signs in flow sheet. Tolerated Procedure well.

## 2017-10-31 NOTE — Op Note (Signed)
SURGERY DATE: 10/31/2017  PRE-OP DIAGNOSIS:  1. Right subacromial impingement 2. Right acromioclavicular joint osteoarthritis  POST-OP DIAGNOSIS: 1. Right subacromial impingement 2. Right acromioclavicular joint osteoarthritis  PROCEDURES:  1. Right arthroscopic distal clavicle excision 2. Right extensive debridement of shoulder (glenohumeral and subacromial spaces) 3. Right subacromial decompression  SURGEON: Rosealee Albee, MD  ANESTHESIA: Gen with postoperative interscalene block  ESTIMATED BLOOD LOSS: 5cc  DRAINS:  none  TOTAL IV FLUIDS: per anesthesia   SPECIMENS: none  IMPLANTS:  none  OPERATIVE FINDINGS:  Examination under anesthesia: A careful examination under anesthesia was performed.  Passive range of motion was: FF: 160; ER at side: 50; ER in abduction: 90; IR in abduction: 50.  Anterior load shift: NT.  Posterior load shift: NT.  Sulcus in neutral: NT.  Sulcus in ER: NT.    Intra-operative findings: A thorough arthroscopic examination of the shoulder was performed.  The findings are: 1. Biceps tendon: normal without erythema or injection 2. Superior labrum: no SLAP tear, but some redundancy/fraying of labrum present 3. Posterior labrum and capsule: normal 4. Inferior capsule and inferior recess: normal 5. Glenoid cartilage surface: areas of Grade degenerative changes centrally and extending anteriorly/superiorly 6. Supraspinatus attachment: normal 7. Posterior rotator cuff attachment: normal 8. Humeral head articular cartilage: normal 9. Rotator interval: normal 10: Subscapularis tendon: attachment intact 11. Anterior labrum: normal 12. IGHL: normal  OPERATIVE REPORT:   Indications for procedure: Patrick Cobb is a 42 y.o. year old male with over 4 month history of R shoulder pain. He underwent a trial of non-operative management including physical therapy, activity modifications, and corticosteroid injections. He received excellent relief of pain with  corticosteroid injection, but only received 3 days of pain relief. Imaging showed a prominent acromial spur consistent with Type 3 acromion as well as a degenerative acromioclavicular joint. Clinical exam correlated with these two issues being sources of pain. After discussion of risks, benefits, and alternatives to surgery, the patient elected to proceed with above procedures.   Procedure in detail:  I identified Patrick Cobb in the pre-operative holding area.  I marked the operative shoulder with my initials. I reviewed the risks and benefits of the proposed surgical intervention, and the patient (and/or patient's guardian) wished to proceed.  Anesthesia was then performed with an interscalene block.  The patient was transferred to the operative suite and placed in the beach chair position.    SCDs were placed on the lower extremities. Appropriate IV antibiotics were administered prior to incision. The operative upper extremity was then prepped and draped in standard fashion. A time out was performed confirming the correct extremity, correct patient, and correct procedure.   I then created a standard posterior portal with an 11 blade. The glenohumeral joint was easily entered with a blunt trochar and the arthroscope introduced. The findings of diagnostic arthroscopy are described above. I debrided the tissue about the superior labrum that was redundant and frayed. I then coagulated this tissue to obtain hemostasis and reduce the risk of post-operative swelling using an Arthrocare radiofrequency device.  Next, the arthroscope was then introduced into the subacromial space. A direct lateral portal was created with an 11-blade after spinal needle localization. An extensive subacromial bursectomy was performed using a combination of the shaver and Arthrocare wand. The entire acromial undersurface was exposed and the CA ligament was subperiosteally elevated to expose the anterior acromial hook. A 5.20mm  barrel burr was used to create a flat anterior and lateral aspect of the  acromion, converting it from a Type 3 to a Type 1 acromion. Care was made to keep the deltoid fascia intact.  I then turned my attention to the arthroscopic distal clavicle excision. I identified the acromioclavicular joint. Surrounding bursal tissue was debrided and the edges of the joint were identified. I used the 5.200mm barrel burr to remove the distal clavicle parallel to the edge of the acromion. I was able to fit two widths of the burr into the space between the distal clavicle and acromion, signifying that I had removed ~4010mm of distal clavicle. Adequate resection was confirmed by visualizing the [facility] from the anterior portal and measuring with a probe. Hemostasis was achieved with an Arthrocare wand.   Fluid was evacuated from the shoulder, and the portals were closed with 3-0 Nylon. Xeroform was applied to the portals. A sterile dressing was applied, followed by a Polar Care sleeve and a shoulder immobilizer/sling. The patient awoke from anesthesia without difficulty and was transferred to the PACU in stable condition.    COMPLICATIONS: none  DISPOSITION: plan for discharge home after recovery in PACU  POSTOPERATIVE PLAN: Sling for comfort for 1-2 weeks. NWB on operative extremity. ASA x 2 weeks for DVT ppx. PT on POD#3. Follow up with me in ~2 weeks as outpatient. Use Shoulder Scope - SAD/DCE rehab protocol.

## 2017-10-31 NOTE — Discharge Instructions (Signed)
Post-Op Instructions - Arthroscopic Subacromial Decompression and/or Distal Clavicle Excision  1. Bracing: You will wear a shoulder immobilizer or sling for no longer than 2 weeks. You can self wean from sling when it is comfortable to do so.   2. Driving: No driving for at least 2 weeks post-op.   3. Activity: Progress to motion as tolerated, moving from passive to active-assisted to active motion. Goal for motion by 4 weeks is 140 degrees forward flexion, 40 ER, IR behind back. Avoid abduction and ER/IR until after 4 weeks.  Return to daily activities normally takes 2-3 months on average.  4. Physical Therapy: Begins 3-4 days after surgery  5. Medications:  - You will be provided a prescription for narcotic pain medicine. After surgery, take 1-2 narcotic tablets every 4 hours if needed for severe pain.  - A prescription for anti-nausea medication will be provided in case the narcotic medicine causes nausea - take 1 tablet every 6 hours only if nauseated.   - Take ASA 325mg /day x 2 weeks to help prevent DVTs/PEs (blood clots).  - Take ibuprofen 800mg  three times/day with food. Stop if there is any GI irritation.   If you are taking prescription medication for anxiety, depression, insomnia, muscle spasm, chronic pain, or for attention deficit disorder, you are advised that you are at a higher risk of adverse effects with use of narcotics post-op, including narcotic addiction/dependence, depressed breathing, death. If you use non-prescribed substances: alcohol, marijuana, cocaine, heroin, methamphetamines, etc., you are at a higher risk of adverse effects with use of narcotics post-op, including narcotic addiction/dependence, depressed breathing, death. You are advised that taking > 50 morphine milligram equivalents (MME) of narcotic pain medication per day results in twice the risk of overdose or death. For your prescription provided: hydrocodone 5 mg - taking more than 8 tablets per day would  result in > 50 morphine milligram equivalents (MME) of narcotic pain medication. Be advised that we will prescribe narcotics short-term, for acute post-operative pain only - 3 weeks for major operations such as shoulder repair/reconstruction surgeries.    6. Post-Op Appointment:  Your first post-op appointment will be 10-14 days post-op.  7. Work or School: For most, but not all procedures, we advise staying out of work or school for at least 1 to 2 weeks in order to recover from the stress of surgery and to allow time for healing.   If you need a work or school note this can be provided.   8. Smoking: If you are a smoker, you need to refrain from smoking in the postoperative period. The nicotine in cigarettes will inhibit healing of your shoulder repair and decrease the chance of successful repair. Similarly, nicotine containing products (gum, patches) should be avoided.   Post-operative Brace: Apply and remove the brace you received as you were instructed to at the time of fitting and as described in detail as the braces instructions for use indicate.  Wear the brace for the period of time prescribed by your physician.  The brace can be cleaned with soap and water and allowed to air dry only.  Should the brace result in increased pain, decreased feeling (numbness/tingling), increased swelling or an overall worsening of your medical condition, please contact your doctor immediately.  If an emergency situation occurs as a result of wearing the brace after normal business hours, please dial 911 and seek immediate medical attention.  Let your doctor know if you have any further questions about the brace issued  to you. Refer to the shoulder sling instructions for use if you have any questions regarding the correct fit of your shoulder sling.  [facility] Customer Care for Troubleshooting: [phone]  Video that illustrates how to properly use a shoulder sling: "Instructions for Proper Use of an  Orthopaedic Sling" http://bass.com/https://www.youtube.com/watch?v=AHZpn_Xo45w       General Anesthesia, Adult, Care After These instructions provide you with information about caring for yourself after your procedure. Your health care provider may also give you more specific instructions. Your treatment has been planned according to current medical practices, but problems sometimes occur. Call your health care provider if you have any problems or questions after your procedure. What can I expect after the procedure? After the procedure, it is common to have:  Vomiting.  A sore throat.  Mental slowness.  It is common to feel:  Nauseous.  Cold or shivery.  Sleepy.  Tired.  Sore or achy, even in parts of your body where you did not have surgery.  Follow these instructions at home: For at least 24 hours after the procedure:  Do not: ? Participate in activities where you could fall or become injured. ? Drive. ? Use heavy machinery. ? Drink alcohol. ? Take sleeping pills or medicines that cause drowsiness. ? Make important decisions or sign legal documents. ? Take care of children on your own.  Rest. Eating and drinking  If you vomit, drink water, juice, or soup when you can drink without vomiting.  Drink enough fluid to keep your urine clear or pale yellow.  Make sure you have little or no nausea before eating solid foods.  Follow the diet recommended by your health care provider. General instructions  Have a responsible adult stay with you until you are awake and alert.  Return to your normal activities as told by your health care provider. Ask your health care provider what activities are safe for you.  Take over-the-counter and prescription medicines only as told by your health care provider.  If you smoke, do not smoke without supervision.  Keep all follow-up visits as told by your health care provider. This is important. Contact a health care provider if:  You  continue to have nausea or vomiting at home, and medicines are not helpful.  You cannot drink fluids or start eating again.  You cannot urinate after 8-12 hours.  You develop a skin rash.  You have fever.  You have increasing redness at the site of your procedure. Get help right away if:  You have difficulty breathing.  You have chest pain.  You have unexpected bleeding.  You feel that you are having a life-threatening or urgent problem. This information is not intended to replace advice given to you by your health care provider. Make sure you discuss any questions you have with your health care provider. Document Released: 12/05/2000 Document Revised: 02/01/2016 Document Reviewed: 08/13/2015 Elsevier Interactive Patient Education  [employer]2018 [employer]

## 2018-05-04 DIAGNOSIS — Z8719 Personal history of other diseases of the digestive system: Secondary | ICD-10-CM | POA: Insufficient documentation

## 2018-09-16 ENCOUNTER — Emergency Department: Payer: BLUE CROSS/BLUE SHIELD

## 2018-09-16 ENCOUNTER — Emergency Department
Admission: EM | Admit: 2018-09-16 | Discharge: 2018-09-16 | Disposition: A | Discharge status: Home / Self Care | Payer: BLUE CROSS/BLUE SHIELD | Attending: Student in an Organized Health Care Education/Training Program | Admitting: Student in an Organized Health Care Education/Training Program

## 2018-09-16 ENCOUNTER — Encounter: Payer: Self-pay | Admitting: Emergency Medicine

## 2018-09-16 ENCOUNTER — Other Ambulatory Visit: Payer: Self-pay

## 2018-09-16 DIAGNOSIS — R5381 Other malaise: Secondary | ICD-10-CM | POA: Diagnosis not present

## 2018-09-16 DIAGNOSIS — J189 Pneumonia, unspecified organism: Secondary | ICD-10-CM | POA: Insufficient documentation

## 2018-09-16 DIAGNOSIS — N189 Chronic kidney disease, unspecified: Secondary | ICD-10-CM | POA: Insufficient documentation

## 2018-09-16 DIAGNOSIS — R05 Cough: Secondary | ICD-10-CM | POA: Diagnosis present

## 2018-09-16 DIAGNOSIS — R6883 Chills (without fever): Secondary | ICD-10-CM | POA: Insufficient documentation

## 2018-09-16 DIAGNOSIS — Z79899 Other long term (current) drug therapy: Secondary | ICD-10-CM | POA: Insufficient documentation

## 2018-09-16 DIAGNOSIS — R531 Weakness: Secondary | ICD-10-CM | POA: Insufficient documentation

## 2018-09-16 DIAGNOSIS — R0602 Shortness of breath: Secondary | ICD-10-CM | POA: Insufficient documentation

## 2018-09-16 LAB — CBC WITH DIFFERENTIAL/PLATELET
ABS IMMATURE GRANULOCYTES: 0.34 10*3/uL — AB (ref 0.00–0.07)
Basophils Absolute: 0.1 10*3/uL (ref 0.0–0.1)
Basophils Relative: 0 %
Eosinophils Absolute: 0 10*3/uL (ref 0.0–0.5)
Eosinophils Relative: 0 %
HCT: 43.2 % (ref 39.0–52.0)
HEMOGLOBIN: 14.5 g/dL (ref 13.0–17.0)
Immature Granulocytes: 1 %
LYMPHS PCT: 6 %
Lymphs Abs: 1.6 10*3/uL (ref 0.7–4.0)
MCH: 28.8 pg (ref 26.0–34.0)
MCHC: 33.6 g/dL (ref 30.0–36.0)
MCV: 85.7 fL (ref 80.0–100.0)
MONO ABS: 1.7 10*3/uL — AB (ref 0.1–1.0)
MONOS PCT: 6 %
NEUTROS ABS: 23.9 10*3/uL — AB (ref 1.7–7.7)
Neutrophils Relative %: 87 %
Platelets: 201 10*3/uL (ref 150–400)
RBC: 5.04 MIL/uL (ref 4.22–5.81)
RDW: 13.3 % (ref 11.5–15.5)
WBC: 27.5 10*3/uL — AB (ref 4.0–10.5)
nRBC: 0 % (ref 0.0–0.2)

## 2018-09-16 LAB — BASIC METABOLIC PANEL
Anion gap: 9 (ref 5–15)
BUN: 20 mg/dL (ref 6–20)
CO2: 22 mmol/L (ref 22–32)
CREATININE: 0.91 mg/dL (ref 0.61–1.24)
Calcium: 9 mg/dL (ref 8.9–10.3)
Chloride: 107 mmol/L (ref 98–111)
GFR calc Af Amer: >60 mL/min (ref >60)
Glucose, Bld: 131 mg/dL — ABNORMAL HIGH (ref 70–99)
Potassium: 3.9 mmol/L (ref 3.5–5.1)
Sodium: 138 mmol/L (ref 135–145)

## 2018-09-16 LAB — CG4 I-STAT (LACTIC ACID): LACTIC ACID, VENOUS: 1.64 mmol/L (ref 0.5–1.9)

## 2018-09-16 MED ORDER — SODIUM CHLORIDE 0.9 % IV BOLUS
1000.0000 mL | Freq: Once | INTRAVENOUS | Status: AC
  Administered 2018-09-16: 1000 mL via INTRAVENOUS

## 2018-09-16 MED ORDER — AEROCHAMBER MV MISC: 0 refills | Status: DC

## 2018-09-16 MED ORDER — METHYLPREDNISOLONE SODIUM SUCC 125 MG IJ SOLR
125.0000 mg | Freq: Once | INTRAMUSCULAR | Status: AC
  Administered 2018-09-16: 125 mg via INTRAVENOUS
  Filled 2018-09-16: qty 2

## 2018-09-16 MED ORDER — IPRATROPIUM-ALBUTEROL 0.5-2.5 (3) MG/3ML IN SOLN
3.0000 mL | Freq: Once | RESPIRATORY_TRACT | Status: AC
  Administered 2018-09-16: 3 mL via RESPIRATORY_TRACT

## 2018-09-16 MED ORDER — ALBUTEROL SULFATE (2.5 MG/3ML) 0.083% IN NEBU
5.0000 mg | INHALATION_SOLUTION | Freq: Once | RESPIRATORY_TRACT | Status: AC
  Administered 2018-09-16: 5 mg via RESPIRATORY_TRACT
  Filled 2018-09-16: qty 6

## 2018-09-16 MED ORDER — LEVOFLOXACIN 750 MG PO TABS
750.0000 mg | ORAL_TABLET | Freq: Once | ORAL | Status: AC
  Administered 2018-09-16: 750 mg via ORAL
  Filled 2018-09-16: qty 1

## 2018-09-16 MED ORDER — LEVOFLOXACIN 500 MG PO TABS: 500.0000 mg | ORAL_TABLET | Freq: Every day | ORAL | 0 refills | Status: AC

## 2018-09-16 MED ORDER — PREDNISONE 10 MG PO TABS: 10.0000 mg | ORAL_TABLET | Freq: Every day | ORAL | 0 refills | Status: DC

## 2018-09-16 MED ORDER — KETOROLAC TROMETHAMINE 30 MG/ML IJ SOLN
15.0000 mg | Freq: Once | INTRAMUSCULAR | Status: AC
  Administered 2018-09-16: 15 mg via INTRAVENOUS
  Filled 2018-09-16: qty 1

## 2018-09-16 MED ORDER — ACETAMINOPHEN 500 MG PO TABS
1000.0000 mg | ORAL_TABLET | Freq: Once | ORAL | Status: AC
  Administered 2018-09-16: 1000 mg via ORAL
  Filled 2018-09-16: qty 2

## 2018-09-16 MED ORDER — LEVOFLOXACIN 500 MG PO TABS: 500.0000 mg | ORAL_TABLET | Freq: Every day | ORAL | 0 refills | Status: DC

## 2018-09-16 MED ORDER — MAGNESIUM SULFATE 2 GM/50ML IV SOLN
2.0000 g | Freq: Once | INTRAVENOUS | Status: AC
  Administered 2018-09-16: 2 g via INTRAVENOUS
  Filled 2018-09-16: qty 50

## 2018-09-16 MED ORDER — IPRATROPIUM-ALBUTEROL 0.5-2.5 (3) MG/3ML IN SOLN
3.0000 mL | Freq: Once | RESPIRATORY_TRACT | Status: AC
  Administered 2018-09-16: 3 mL via RESPIRATORY_TRACT
  Filled 2018-09-16: qty 6

## 2018-09-16 MED ORDER — AEROCHAMBER PLUS FLO-VU MISC
1.0000 | Freq: Once | Status: DC
  Filled 2018-09-16: qty 1

## 2018-09-16 NOTE — ED Triage Notes (Signed)
Pt to ED via POV c/o shortness of breath. Pt has hx/o asthma. Pt was started on prednisone and zpac on Tuesday with no improvement. Pt states that he is having productive cough with dark sputum, shortness of breath, chills. Pt is in NAD at this time.

## 2018-09-16 NOTE — ED Provider Notes (Signed)
[facility] Emergency Department Provider Note    First MD Initiated Contact with Patient 09/16/18 1608     (approximate)  I have reviewed the triage vital signs and the nursing notes.   HISTORY  Chief Complaint Shortness of Breath    HPI Patrick Cobb is a 43 y.o. male with a history of asthma presents to the ER for productive cough chills malaise.  Patient states that he did have prescription for steroids as well as a Z-Pak called in 2 days ago but is still feeling weak short of breath with productive cough.  No measured fevers but will develop Reiger's and chills.  Is still currently on steroids.  He did get a dose of DuoNeb on arrival to the ER that he states did cause some improvement.    Past Medical History:  Diagnosis Date  . Arthritis   . Chronic kidney disease    kidney stones  . GERD (gastroesophageal reflux disease)    No family history on file. Past Surgical History:  Procedure Laterality Date  . MYRINGOTOMY WITH TUBE PLACEMENT    . SHOULDER ARTHROSCOPY WITH DISTAL CLAVICLE RESECTION Right 10/31/2017   Procedure: SHOULDER ARTHROSCOPY WITH DISTAL CLAVICLE EXCISION, SUBACROMIAL DECOMPRESSION, EXTENSIVE DEBRIDEMENT;  Surgeon: Leim Fabry, MD;  Location: [facility];  Service: Orthopedics;  Laterality: Right;  MAC W INTERSCALENE BLOCK BEACH CHAIR W/ SPYDER SIMPLE SLING   There are no active problems to display for this patient.     Prior to Admission medications   Medication Sig Start Date End Date Taking? Authorizing Provider  cholecalciferol (VITAMIN D) 400 units TABS tablet Take 400 Units by mouth.    [provider]  gabapentin (NEURONTIN) 100 MG capsule Take 100 mg by mouth 5 (five) times daily.    [provider]  hydrochlorothiazide (HYDRODIURIL) 25 MG tablet Take 25 mg by mouth daily.    [provider]  HYDROcodone-acetaminophen (NORCO) 5-325 MG tablet Take 1-2 tablets by mouth every 4  (four) hours as needed for moderate pain or severe pain. 10/31/17   Leim Fabry, MD  levofloxacin (LEVAQUIN) 500 MG tablet Take 1 tablet (500 mg total) by mouth daily for 10 days. 09/16/18 09/26/18  Merlyn Lot, MD  magnesium 30 MG tablet Take 30 mg by mouth 2 (two) times daily.    [provider]  omeprazole (PRILOSEC) 40 MG capsule Take 40 mg by mouth daily.    [provider]  ondansetron (ZOFRAN ODT) 4 MG disintegrating tablet Take 1 tablet (4 mg total) by mouth every 8 (eight) hours as needed for nausea or vomiting. 10/31/17   Leim Fabry, MD  opium-belladonna (B&O SUPPRETTES) 16.2-60 MG suppository Place 1 suppository rectally every 8 (eight) hours as needed (rectal pain). Patient not taking: Reported on 10/24/2017 10/14/16   Robert Bellow, MD  potassium phosphate, monobasic, (K-PHOS ORIGINAL) 500 MG tablet Take 500 mg by mouth 4 (four) times daily -  with meals and at bedtime.    [provider]  predniSONE (DELTASONE) 10 MG tablet Take 1 tablet (10 mg total) by mouth daily. Day 1-2: Take 50 mg  ( 5 pills) Day 3-4 : Take 40 mg (4pills) Day 5-6: Take 30 mg (3 pills) Day 7-8:  Take 20 mg (2 pills) Day 9:  Take 71m (1 pill) 09/16/18   RMerlyn Lot MD  sertraline (ZOLOFT) 50 MG tablet Take 50 mg by mouth daily.    [provider]  Spacer/Aero-Holding Chambers (AEROCHAMBER MV) inhaler  Use as instructed 09/16/18   Merlyn Lot, MD  tiZANidine (ZANAFLEX) 4 MG capsule Take 4 mg by mouth 3 (three) times daily.    [provider]    Allergies Patient has no known allergies.    Social History Social History   Tobacco Use  . Smoking status: Never Smoker  . Smokeless tobacco: Never Used  Substance Use Topics  . Alcohol use: No  . Drug use: No    Review of Systems Patient denies headaches, rhinorrhea, blurry vision, numbness, shortness of breath, chest pain, edema, cough, abdominal pain, nausea, vomiting, diarrhea, dysuria, fevers,  rashes or hallucinations unless otherwise stated above in HPI. ____________________________________________   PHYSICAL EXAM:  VITAL SIGNS: Vitals:   09/16/18 1630 09/16/18 1806  BP: (!) 138/99 (!) 155/78  Pulse: 99 (!) 118  Resp:  16  Temp:  100 F (37.8 C)  SpO2: 97% 95%    Constitutional: Alert and oriented.  Eyes: Conjunctivae are normal.  Head: Atraumatic. Nose: No congestion/rhinnorhea. Mouth/Throat: Mucous membranes are moist.   Neck: No stridor. Painless ROM.  Cardiovascular: Normal rate, regular rhythm. Grossly normal heart sounds.  Good peripheral circulation. Respiratory: Normal respiratory effort.  + rhonchorus breathsounds in LL fields Gastrointestinal: Soft and nontender. No distention. No abdominal bruits. No CVA tenderness. Genitourinary:  Musculoskeletal: No lower extremity tenderness nor edema.  No joint effusions. Neurologic:  Normal speech and language. No gross focal neurologic deficits are appreciated. No facial droop Skin:  Skin is warm, dry and intact. No rash noted. Psychiatric: Mood and affect are normal. Speech and behavior are normal.  ____________________________________________   LABS (all labs ordered are listed, but only abnormal results are displayed)  Results for orders placed or performed during the hospital encounter of 09/16/18 (from the past 24 hour(s))  CBC with Differential     Status: Abnormal   Collection Time: 09/16/18  2:26 PM  Result Value Ref Range   WBC 27.5 (H) 4.0 - 10.5 K/uL   RBC 5.04 4.22 - 5.81 MIL/uL   Hemoglobin 14.5 13.0 - 17.0 g/dL   HCT 43.2 39.0 - 52.0 %   MCV 85.7 80.0 - 100.0 fL   MCH 28.8 26.0 - 34.0 pg   MCHC 33.6 30.0 - 36.0 g/dL   RDW 13.3 11.5 - 15.5 %   Platelets 201 150 - 400 K/uL   nRBC 0.0 0.0 - 0.2 %   Neutrophils Relative % 87 %   Neutro Abs 23.9 (H) 1.7 - 7.7 K/uL   Lymphocytes Relative 6 %   Lymphs Abs 1.6 0.7 - 4.0 K/uL   Monocytes Relative 6 %   Monocytes Absolute 1.7 (H) 0.1 - 1.0 K/uL     Eosinophils Relative 0 %   Eosinophils Absolute 0.0 0.0 - 0.5 K/uL   Basophils Relative 0 %   Basophils Absolute 0.1 0.0 - 0.1 K/uL   Immature Granulocytes 1 %   Abs Immature Granulocytes 0.34 (H) 0.00 - 0.07 K/uL  Basic metabolic panel     Status: Abnormal   Collection Time: 09/16/18  2:26 PM  Result Value Ref Range   Sodium 138 135 - 145 mmol/L   Potassium 3.9 3.5 - 5.1 mmol/L   Chloride 107 98 - 111 mmol/L   CO2 22 22 - 32 mmol/L   Glucose, Bld 131 (H) 70 - 99 mg/dL   BUN 20 6 - 20 mg/dL   Creatinine, Ser 0.91 0.61 - 1.24 mg/dL   Calcium 9.0 8.9 - 10.3 mg/dL   GFR  calc non Af Amer >60 >60 mL/min   GFR calc Af Amer >60 >60 mL/min   Anion gap 9 5 - 15  CG4 I-STAT (Lactic acid)     Status: None   Collection Time: 09/16/18  7:30 PM  Result Value Ref Range   Lactic Acid, Venous 1.64 0.5 - 1.9 mmol/L   ____________________________________________  EKG My review and personal interpretation at Time: 14:24   Indication: couch sob  Rate: 80  Rhythm: sinus Axis: normal Other: normal intervals, no stemi ____________________________________________  RADiOLOGY  I personally reviewed all radiographic images ordered to evaluate for the above acute complaints and reviewed radiology reports and findings.  These findings were personally discussed with the patient.  Please see medical record for radiology report.  ____________________________________________   PROCEDURES  Procedure(s) performed:  Procedures    Critical Care performed: no ____________________________________________   INITIAL IMPRESSION / ASSESSMENT AND PLAN / ED COURSE  Pertinent labs & imaging results that were available during my care of the patient were reviewed by me and considered in my medical decision making (see chart for details).   DDX: Asthma, copd, CHF, pna, ptx, malignancy, Pe, anemia   Patrick Cobb is a 43 y.o. who presents to the ED with shortness of breath and cough as described above.   Patient nontoxic-appearing but does have rhonchorous breath sounds with leukocytosis.  Chest x-ray and exam is concerning for community-acquired pneumonia with underlying asthma.  Does not have any hypoxia but will give nebulizers as well as fluids and dose of Levaquin given recent dose of azithromycin as an outpatient.  Will observe in the ER for improvement.  Clinical Course as of Sep 17 1955  Sun Sep 16, 2018  1726 Patient with mild tachycardia after nebulizer treatment.  Currently no hypoxia at rest but will need to perform ambulation trial to see if he is hypoxic.  Does have significant leukocytosis but does have what appears to be pneumonia and is on steroids as well.  Does not seem clinically consistent with PE or heart failure.  We will continue to monitor.   [PR]  1824 Patient has had improvement in O2 saturations with ambulation.  Still with mild tachycardia but is received multiple nebulizers.  He has a port score of 40.  Do believe patient benefit from [place].  We will continue observe to ensure the patient is tolerating oral hydration but anticipate patient will be appropriate for outpatient follow-up.   [PR]  1943 Lactate is normal.  Heart rate effervescing.  No hypoxia.  Patient feels improved.  At this point do believe he stable and appropriate for outpatient management.   [PR]    Clinical Course User Index [PR] Merlyn Lot, MD     As part of my medical decision making, I reviewed the following data within the [place] notes reviewed and incorporated, Labs reviewed, notes from prior ED visits.   ____________________________________________   FINAL CLINICAL IMPRESSION(S) / ED DIAGNOSES  Final diagnoses:  Pneumonia of left lung due to infectious organism, unspecified part of lung      NEW MEDICATIONS STARTED DURING THIS VISIT:  Current Discharge Medication List    START taking these medications   Details  levofloxacin (LEVAQUIN)  500 MG tablet Take 1 tablet (500 mg total) by mouth daily for 10 days. Qty: 10 tablet, Refills: 0    predniSONE (DELTASONE) 10 MG tablet Take 1 tablet (10 mg total) by mouth daily. Day 1-2: Take 50 mg  (  5 pills) Day 3-4 : Take 40 mg (4pills) Day 5-6: Take 30 mg (3 pills) Day 7-8:  Take 20 mg (2 pills) Day 9:  Take 96m (1 pill) Qty: 29 tablet, Refills: 0    Spacer/Aero-Holding Chambers (AEROCHAMBER MV) inhaler Use as instructed Qty: 1 each, Refills: 0         Note:  This document was prepared using Dragon voice recognition software and may include unintentional dictation errors.    RMerlyn Lot MD 09/16/18 [phone]

## 2018-09-16 NOTE — ED Notes (Signed)
Pt ambulated with O2  Monitor. Pt's sat remained 93-94%. HR during ambulation 128.

## 2018-09-16 NOTE — ED Notes (Signed)
Pt verbalized understanding of discharge instructions. NAD at this time. 

## 2018-09-30 DIAGNOSIS — J4520 Mild intermittent asthma, uncomplicated: Secondary | ICD-10-CM | POA: Insufficient documentation

## 2019-12-12 ENCOUNTER — Ambulatory Visit
Admission: RE | Admit: 2019-12-12 | Discharge: 2019-12-12 | Disposition: A | Discharge status: Home / Self Care | Payer: BC Managed Care – PPO | Source: Ambulatory Visit | Attending: Urology | Admitting: Urology

## 2019-12-12 ENCOUNTER — Other Ambulatory Visit: Payer: Self-pay | Admitting: Radiology

## 2019-12-12 ENCOUNTER — Ambulatory Visit (INDEPENDENT_AMBULATORY_CARE_PROVIDER_SITE_OTHER): Payer: BC Managed Care – PPO | Admitting: Urology

## 2019-12-12 ENCOUNTER — Other Ambulatory Visit: Payer: Self-pay | Admitting: Urology

## 2019-12-12 ENCOUNTER — Other Ambulatory Visit: Payer: Self-pay

## 2019-12-12 ENCOUNTER — Encounter: Payer: Self-pay | Admitting: Urology

## 2019-12-12 VITALS — BP 129/90 | HR 92 | Ht 74.0 in | Wt 291.0 lb

## 2019-12-12 DIAGNOSIS — Z5309 Procedure and treatment not carried out because of other contraindication: Secondary | ICD-10-CM | POA: Insufficient documentation

## 2019-12-12 DIAGNOSIS — N200 Calculus of kidney: Secondary | ICD-10-CM | POA: Insufficient documentation

## 2019-12-12 DIAGNOSIS — E559 Vitamin D deficiency, unspecified: Secondary | ICD-10-CM | POA: Insufficient documentation

## 2019-12-12 DIAGNOSIS — H5010 Unspecified exotropia: Secondary | ICD-10-CM | POA: Insufficient documentation

## 2019-12-12 DIAGNOSIS — R1031 Right lower quadrant pain: Secondary | ICD-10-CM | POA: Diagnosis present

## 2019-12-12 DIAGNOSIS — Z20822 Contact with and (suspected) exposure to covid-19: Secondary | ICD-10-CM | POA: Insufficient documentation

## 2019-12-12 DIAGNOSIS — E785 Hyperlipidemia, unspecified: Secondary | ICD-10-CM | POA: Insufficient documentation

## 2019-12-12 DIAGNOSIS — R42 Dizziness and giddiness: Secondary | ICD-10-CM | POA: Insufficient documentation

## 2019-12-12 DIAGNOSIS — F419 Anxiety disorder, unspecified: Secondary | ICD-10-CM | POA: Insufficient documentation

## 2019-12-12 DIAGNOSIS — K219 Gastro-esophageal reflux disease without esophagitis: Secondary | ICD-10-CM | POA: Insufficient documentation

## 2019-12-12 LAB — URINALYSIS, COMPLETE
Bilirubin, UA: NEGATIVE
Glucose, UA: NEGATIVE
Ketones, UA: NEGATIVE
Nitrite, UA: NEGATIVE
Protein,UA: NEGATIVE
Specific Gravity, UA: 1.02 (ref 1.005–1.030)
Urobilinogen, Ur: 0.2 mg/dL (ref 0.2–1.0)
pH, UA: 7 (ref 5.0–7.5)

## 2019-12-12 LAB — MICROSCOPIC EXAMINATION: Bacteria, UA: NONE SEEN

## 2019-12-12 MED ORDER — CIPROFLOXACIN HCL 500 MG PO TABS
ORAL_TABLET | ORAL | Status: AC
  Filled 2019-12-12: qty 1

## 2019-12-12 MED ORDER — ONDANSETRON HCL 4 MG/2ML IJ SOLN
INTRAMUSCULAR | Status: AC
  Filled 2019-12-12: qty 2

## 2019-12-12 MED ORDER — HYDROCODONE-ACETAMINOPHEN 5-325 MG PO TABS: 1.0000 | ORAL_TABLET | Freq: Four times a day (QID) | ORAL | 0 refills | Status: AC | PRN

## 2019-12-12 MED ORDER — DIPHENHYDRAMINE HCL 25 MG PO CAPS
ORAL_CAPSULE | ORAL | Status: AC
  Filled 2019-12-12: qty 1

## 2019-12-12 MED ORDER — DIAZEPAM 5 MG PO TABS
ORAL_TABLET | ORAL | Status: AC
  Filled 2019-12-12: qty 2

## 2019-12-12 NOTE — Patient Instructions (Addendum)
Lithotripsy  Lithotripsy is a treatment that can sometimes help eliminate kidney stones and the pain that they cause. A form of lithotripsy, also known as extracorporeal shock wave lithotripsy, is a nonsurgical procedure that crushes a kidney stone with shock waves. These shock waves pass through your body and focus on the kidney stone. They cause the kidney stone to break up while it is still in the urinary tract. This makes it easier for the smaller pieces of stone to pass in the urine. Tell a health care provider about:  Any allergies you have.  All medicines you are taking, including vitamins, herbs, eye drops, creams, and over-the-counter medicines.  Any blood disorders you have.  Any surgeries you have had.  Any medical conditions you have.  Whether you are pregnant or may be pregnant.  Any problems you or family members have had with anesthetic medicines. What are the risks? Generally, this is a safe procedure. However, problems may occur, including:  Infection.  Bleeding of the kidney.  Bruising of the kidney or skin.  Scarring of the kidney, which can lead to: ? Increased blood pressure. ? Poor kidney function. ? Return (recurrence) of kidney stones.  Damage to other structures or organs, such as the liver, colon, spleen, or pancreas.  Blockage (obstruction) of the the tube that carries urine from the kidney to the bladder (ureter).  Failure of the kidney stone to break into pieces (fragments). What happens before the procedure? Staying hydrated Follow instructions from your health care provider about hydration, which may include:  Up to 2 hours before the procedure - you may continue to drink clear liquids, such as water, clear fruit juice, black coffee, and plain tea. Eating and drinking restrictions Follow instructions from your health care provider about eating and drinking, which may include:  8 hours before the procedure - stop eating heavy meals or foods  such as meat, fried foods, or fatty foods.  6 hours before the procedure - stop eating light meals or foods, such as toast or cereal.  6 hours before the procedure - stop drinking milk or drinks that contain milk.  2 hours before the procedure - stop drinking clear liquids. General instructions  Plan to have someone take you home from the hospital or clinic.  Ask your health care provider about: ? Changing or stopping your regular medicines. This is especially important if you are taking diabetes medicines or blood thinners. ? Taking medicines such as aspirin and ibuprofen. These medicines and other NSAIDs can thin your blood. Do not take these medicines for 7 days before your procedure if your health care provider instructs you not to.  You may have tests, such as: ? Blood tests. ? Urine tests. ? Imaging tests, such as a CT scan. What happens during the procedure?  To lower your risk of infection: ? Your health care team will wash or sanitize their hands. ? Your skin will be washed with soap.  An IV tube will be inserted into one of your veins. This tube will give you fluids and medicines.  You will be given one or more of the following: ? A medicine to help you relax (sedative). ? A medicine to make you fall asleep (general anesthetic).  A water-filled cushion may be placed behind your kidney or on your abdomen. In some cases you may be placed in a tub of lukewarm water.  Your body will be positioned in a way that makes it easy to target the kidney   stone.  A flexible tube with holes in it (stent) may be placed in the ureter. This will help keep urine flowing from the kidney if the fragments of the stone have been blocking the ureter.  An X-ray or ultrasound exam will be done to locate your stone.  Shock waves will be aimed at the stone. If you are awake, you may feel a tapping sensation as the shock waves pass through your body. The procedure may vary among health care  providers and hospitals. What happens after the procedure?  You may have an X-ray to see whether the procedure was able to break up the kidney stone and how much of the stone has passed. If large stone fragments remain after treatment, you may need to have a second procedure at a later time.  Your blood pressure, heart rate, breathing rate, and blood oxygen level will be monitored until the medicines you were given have worn off.  You may be given antibiotics or pain medicine as needed.  If a stent was placed in your ureter during surgery, it may stay in place for a few weeks.  You may need strain your urine to collect pieces of the kidney stone for testing.  You will need to drink plenty of water.  Do not drive for 24 hours if you were given a sedative. Summary  Lithotripsy is a treatment that can sometimes help eliminate kidney stones and the pain that they cause.  A form of lithotripsy, also known as extracorporeal shock wave lithotripsy, is a nonsurgical procedure that crushes a kidney stone with shock waves.  Generally, this is a safe procedure. However, problems may occur, including damage to the kidney or other organs, infection, or obstruction of the tube that carries urine from the kidney to the bladder (ureter).  When you go home, you will need to drink plenty of water. You may be asked to strain your urine to collect pieces of the kidney stone for testing. This information is not intended to replace advice given to you by your health care provider. Make sure you discuss any questions you have with your health care provider. Document Revised: 12/10/2018 Document Reviewed: 07/20/2016 Elsevier Patient Education  2020 [employer].   Dietary Guidelines to Help Prevent Kidney Stones Kidney stones are deposits of minerals and salts that form inside your kidneys. Your risk of developing kidney stones may be greater depending on your diet, your lifestyle, the medicines you take,  and whether you have certain medical conditions. Most people can reduce their chances of developing kidney stones by following the instructions below. Depending on your overall health and the type of kidney stones you tend to develop, your dietitian may give you more specific instructions. What are tips for following this plan? Reading food labels  Choose foods with "no salt added" or "low-salt" labels. Limit your sodium intake to less than 1500 mg per day.  Choose foods with calcium for each meal and snack. Try to eat about 300 mg of calcium at each meal. Foods that contain 200-500 mg of calcium per serving include: ? 8 oz (237 ml) of milk, fortified nondairy milk, and fortified fruit juice. ? 8 oz (237 ml) of kefir, yogurt, and soy yogurt. ? 4 oz (118 ml) of tofu. ? 1 oz of cheese. ? 1 cup (300 g) of dried figs. ? 1 cup (91 g) of cooked broccoli. ? 1-3 oz can of sardines or mackerel.  Most people need 1000 to 1500 mg of  calcium each day. Talk to your dietitian about how much calcium is recommended for you. Shopping  Buy plenty of fresh fruits and vegetables. Most people do not need to avoid fruits and vegetables, even if they contain nutrients that may contribute to kidney stones.  When shopping for convenience foods, choose: ? Whole pieces of fruit. ? Premade salads with dressing on the side. ? Low-fat fruit and yogurt smoothies.  Avoid buying frozen meals or prepared deli foods.  Look for foods with live cultures, such as yogurt and kefir. Cooking  Do not add salt to food when cooking. Place a salt shaker on the table and allow each person to add his or her own salt to taste.  Use vegetable protein, such as beans, textured vegetable protein (TVP), or tofu instead of meat in pasta, casseroles, and soups. Meal planning   Eat less salt, if told by your dietitian. To do this: ? Avoid eating processed or premade food. ? Avoid eating fast food.  Eat less animal protein,  including cheese, meat, poultry, or fish, if told by your dietitian. To do this: ? Limit the number of times you have meat, poultry, fish, or cheese each week. Eat a diet free of meat at least 2 days a week. ? Eat only one serving each day of meat, poultry, fish, or seafood. ? When you prepare animal protein, cut pieces into small portion sizes. For most meat and fish, one serving is about the size of one deck of cards.  Eat at least 5 servings of fresh fruits and vegetables each day. To do this: ? Keep fruits and vegetables on hand for snacks. ? Eat 1 piece of fruit or a handful of berries with breakfast. ? Have a salad and fruit at lunch. ? Have two kinds of vegetables at dinner.  Limit foods that are high in a substance called oxalate. These include: ? Spinach. ? Rhubarb. ? Beets. ? Potato chips and french fries. ? Nuts.  If you regularly take a diuretic medicine, make sure to eat at least 1-2 fruits or vegetables high in potassium each day. These include: ? Avocado. ? Banana. ? Orange, prune, carrot, or tomato juice. ? Baked potato. ? Cabbage. ? Beans and split peas. General instructions   Drink enough fluid to keep your urine clear or pale yellow. This is the most important thing you can do.  Talk to your health care provider and dietitian about taking daily supplements. Depending on your health and the cause of your kidney stones, you may be advised: ? Not to take supplements with vitamin C. ? To take a calcium supplement. ? To take a daily probiotic supplement. ? To take other supplements such as magnesium, fish oil, or vitamin B6.  Take all medicines and supplements as told by your health care provider.  Limit alcohol intake to no more than 1 drink a day for nonpregnant women and 2 drinks a day for men. One drink equals 12 oz of beer, 5 oz of wine, or 1 oz of hard liquor.  Lose weight if told by your health care provider. Work with your dietitian to find strategies  and an eating plan that works best for you. What foods are not recommended? Limit your intake of the following foods, or as told by your dietitian. Talk to your dietitian about specific foods you should avoid based on the type of kidney stones and your overall health. Grains Breads. Bagels. Rolls. Baked goods. Salted crackers. Cereal. Pasta. Vegetables  Spinach. Rhubarb. Beets. Canned vegetables. Angie Fava. Olives. Meats and other protein foods Nuts. Nut butters. Large portions of meat, poultry, or fish. Salted or cured meats. Deli meats. Hot dogs. Sausages. Dairy Cheese. Beverages Regular soft drinks. Regular vegetable juice. Seasonings and other foods Seasoning blends with salt. Salad dressings. Canned soups. Soy sauce. Ketchup. Barbecue sauce. Canned pasta sauce. Casseroles. Pizza. Lasagna. Frozen meals. Potato chips. [place] fries. Summary  You can reduce your risk of kidney stones by making changes to your diet.  The most important thing you can do is drink enough fluid. You should drink enough fluid to keep your urine clear or pale yellow.  Ask your health care provider or dietitian how much protein from animal sources you should eat each day, and also how much salt and calcium you should have each day. This information is not intended to replace advice given to you by your health care provider. Make sure you discuss any questions you have with your health care provider. Document Revised: 12/19/2018 Document Reviewed: 08/09/2016 Elsevier Patient Education  2020 [facility]  You will receive a box/kit in the mail that will have a urine jug and instructions in the kit.  When the box arrives you will need to call our office [phone] to schedule a LAB appointment.  You will need to do a 24hour urine and this should be done during the days that our office will be open.  For example any day from Sunday through  Thursday.  If you take Vitamin C '100mg'$  or greater please stop this 5 days prior to collection.  How to collect the urine sample: On the day you start the urine sample this 1st morning urine should NOT be collected.  For the rest of the day including all night urines should be collected.  On the next morning the 1st urine should be collected and then you will be finished with the urine collections.  You will need to bring the box with you on your LAB appointment day after urine has been collected and all instructions are complete in the box.  Your blood will be drawn and the box will be collected by our Lab employee to be sent off for analysis.  When urine and blood is complete you will need to schedule a follow up appointment for lab results.

## 2019-12-12 NOTE — Addendum Note (Signed)
Addended by: Martha Clan on: 12/12/2019 10:37 AM   Modules accepted: Orders

## 2019-12-12 NOTE — Progress Notes (Addendum)
12/12/19 9:15 AM   Patrick Cobb 10/10/1975 [id number]  CC: Right-sided groin pain  HPI: I saw [name] in urology clinic for right-sided groin pain.  He is a 44 year old male with an extensive history of nephrolithiasis including 3 spontaneously passed stones, as well as bilateral ureteral stones in 2012 requiring ureteroscopy by Dr. Jacqlyn Larsen who presents with 1 week of right-sided flank and groin pain.  He felt like the pain improved a little bit in the middle of the week, however recurred overnight with some right-sided renal colic.  He denies any fevers or chills.  He has also had some dysuria.  He has not had any imaging performed.  He saw his PCP on Monday who prescribed Flomax and Norco for his pain.  He has not taken any Aleve or ibuprofen over the last week.  He does not eat spinach, but he drinks primarily almond milk.  He has been n.p.o. since midnight.  Urinalysis today notable for 6-10 WBCs, 3-10 RBCs, no epithelial cells, granular casts, no bacteria, no yeast, trace leukocytes, nitrite negative, pH 7.  Serum calcium normal at 9.0.   PMH: Past Medical History:  Diagnosis Date  . Arthritis   . Chronic kidney disease    kidney stones  . GERD (gastroesophageal reflux disease)   . Nephrolithiasis     Surgical History: Past Surgical History:  Procedure Laterality Date  . MYRINGOTOMY WITH TUBE PLACEMENT    . SHOULDER ARTHROSCOPY WITH DISTAL CLAVICLE RESECTION Right 10/31/2017   Procedure: SHOULDER ARTHROSCOPY WITH DISTAL CLAVICLE EXCISION, SUBACROMIAL DECOMPRESSION, EXTENSIVE DEBRIDEMENT;  Surgeon: Leim Fabry, MD;  Location: [facility];  Service: Orthopedics;  Laterality: Right;  MAC W INTERSCALENE BLOCK BEACH CHAIR W/ SPYDER SIMPLE SLING  . URETEROSCOPY WITH HOLMIUM LASER LITHOTRIPSY  2012    Family History: Family History  Problem Relation Age of Onset  . Bladder Cancer Neg Hx   . Prostate cancer Neg Hx   . Kidney cancer Neg Hx     Social History:   reports that he has never smoked. He has never used smokeless tobacco. He reports that he does not drink alcohol or use drugs.  Physical Exam: BP 129/90   Pulse 92   Ht _0  (1.88 m)   Wt 291 lb (132 kg)   BMI 37.36 kg/m    Constitutional:  Alert and oriented, No acute distress. Cardiovascular: Regular rate and rhythm Respiratory: Clear to auscultation bilaterally GI: Abdomen is soft, nontender, nondistended, no abdominal masses  Laboratory Data: Reviewed, see HPI  Pertinent Imaging: None to review  Assessment & Plan:   In summary, he is a 44 year old male with 1 week of right-sided groin pain worrisome for stone episode.  There is no imaging to review.  No evidence of infection on urinalysis, but microscopic hematuria indicative of likely persistent stone disease.  We discussed options at length including observation or further imaging with either KUB or CT.  We discussed the risks and benefits at length.  He would like to proceed with CT today for definitive diagnosis.  We discussed general stone prevention strategies including adequate hydration with goal of producing 2.5 L of urine daily, increasing citric acid intake, increasing calcium intake during high oxalate meals, minimizing animal protein, and decreasing salt intake. Information about dietary recommendations given today.   CT stone protocol ordered for this morning NPO until CT reviewed, will return to clinic to review CT results Will need [id number] urine metabolic work-up in follow-up  ADDENDUM: CT shows  a 7 mm right UPJ stone with upstream hydronephrosis, 900HU, 15cm SSD.  Clearly seen on KUB today.  He would like to pursue shockwave lithotripsy.  Unable to do shockwave lithotripsy today as he had Mobic yesterday, will schedule for next Thursday.  Patrick Madrid, MD 12/12/2019  [facility] [address] [place],  [address] [phone]

## 2019-12-12 NOTE — H&P (View-Only) (Signed)
12/12/19 9:15 AM   Patrick Cobb 11/29/1975 [id number]  CC: Right-sided groin pain  HPI: I saw Mr. Patrick Cobb in urology clinic for right-sided groin pain.  He is a 44 year old male with an extensive history of nephrolithiasis including 3 spontaneously passed stones, as well as bilateral ureteral stones in 2012 requiring ureteroscopy by Dr. Jacqlyn Larsen who presents with 1 week of right-sided flank and groin pain.  He felt like the pain improved a little bit in the middle of the week, however recurred overnight with some right-sided renal colic.  He denies any fevers or chills.  He has also had some dysuria.  He has not had any imaging performed.  He saw his PCP on Monday who prescribed Flomax and Norco for his pain.  He has not taken any Aleve or ibuprofen over the last week.  He does not eat spinach, but he drinks primarily almond milk.  He has been n.p.o. since midnight.  Urinalysis today notable for 6-10 WBCs, 3-10 RBCs, no epithelial cells, granular casts, no bacteria, no yeast, trace leukocytes, nitrite negative, pH 7.  Serum calcium normal at 9.0.   PMH: Past Medical History:  Diagnosis Date  . Arthritis   . Chronic kidney disease    kidney stones  . GERD (gastroesophageal reflux disease)   . Nephrolithiasis     Surgical History: Past Surgical History:  Procedure Laterality Date  . MYRINGOTOMY WITH TUBE PLACEMENT    . SHOULDER ARTHROSCOPY WITH DISTAL CLAVICLE RESECTION Right 10/31/2017   Procedure: SHOULDER ARTHROSCOPY WITH DISTAL CLAVICLE EXCISION, SUBACROMIAL DECOMPRESSION, EXTENSIVE DEBRIDEMENT;  Surgeon: Leim Fabry, MD;  Location: [facility];  Service: Orthopedics;  Laterality: Right;  MAC W INTERSCALENE BLOCK BEACH CHAIR W/ SPYDER SIMPLE SLING  . URETEROSCOPY WITH HOLMIUM LASER LITHOTRIPSY  2012    Family History: Family History  Problem Relation Age of Onset  . Bladder Cancer Neg Hx   . Prostate cancer Neg Hx   . Kidney cancer Neg Hx     Social History:   reports that he has never smoked. He has never used smokeless tobacco. He reports that he does not drink alcohol or use drugs.  Physical Exam: BP 129/90   Pulse 92   Ht _0  (1.88 m)   Wt 291 lb (132 kg)   BMI 37.36 kg/m    Constitutional:  Alert and oriented, No acute distress. Cardiovascular: Regular rate and rhythm Respiratory: Clear to auscultation bilaterally GI: Abdomen is soft, nontender, nondistended, no abdominal masses  Laboratory Data: Reviewed, see HPI  Pertinent Imaging: None to review  Assessment & Plan:   In summary, he is a 44 year old male with 1 week of right-sided groin pain worrisome for stone episode.  There is no imaging to review.  No evidence of infection on urinalysis, but microscopic hematuria indicative of likely persistent stone disease.  We discussed options at length including observation or further imaging with either KUB or CT.  We discussed the risks and benefits at length.  He would like to proceed with CT today for definitive diagnosis.  We discussed general stone prevention strategies including adequate hydration with goal of producing 2.5 L of urine daily, increasing citric acid intake, increasing calcium intake during high oxalate meals, minimizing animal protein, and decreasing salt intake. Information about dietary recommendations given today.   CT stone protocol ordered for this morning NPO until CT reviewed, will return to clinic to review CT results Will need 03-[id number] urine metabolic work-up in follow-up  ADDENDUM: CT shows  a 7 mm right UPJ stone with upstream hydronephrosis, 900HU, 15cm SSD.  Clearly seen on KUB today.  He would like to pursue shockwave lithotripsy.  Unable to do shockwave lithotripsy today as he had Mobic yesterday, will schedule for next Thursday.  Patrick Madrid, MD 12/12/2019  [facility] [address], [place] [address] [phone]

## 2019-12-12 NOTE — Progress Notes (Signed)
Pt reports taking meloxicam yesterday. Dr Lonna Cobb notified. Decision to postpone Procedure. To be rescheduled.

## 2019-12-17 ENCOUNTER — Other Ambulatory Visit: Payer: Self-pay

## 2019-12-17 ENCOUNTER — Other Ambulatory Visit
Admission: RE | Admit: 2019-12-17 | Discharge: 2019-12-17 | Disposition: A | Discharge status: Home / Self Care | Payer: BC Managed Care – PPO | Source: Ambulatory Visit | Attending: Urology | Admitting: Urology

## 2019-12-17 DIAGNOSIS — Z20822 Contact with and (suspected) exposure to covid-19: Secondary | ICD-10-CM | POA: Diagnosis not present

## 2019-12-17 DIAGNOSIS — N200 Calculus of kidney: Secondary | ICD-10-CM | POA: Diagnosis not present

## 2019-12-17 DIAGNOSIS — Z5309 Procedure and treatment not carried out because of other contraindication: Secondary | ICD-10-CM | POA: Diagnosis not present

## 2019-12-17 LAB — SARS CORONAVIRUS 2 (TAT 6-24 HRS): SARS Coronavirus 2: NEGATIVE

## 2019-12-19 ENCOUNTER — Encounter: Payer: Self-pay | Admitting: Urology

## 2019-12-19 ENCOUNTER — Ambulatory Visit: Payer: BC Managed Care – PPO

## 2019-12-19 ENCOUNTER — Encounter
Admission: RE | Disposition: A | Discharge status: Home / Self Care | Payer: Self-pay | Source: Home / Self Care | Attending: Urology

## 2019-12-19 ENCOUNTER — Ambulatory Visit
Admission: RE | Admit: 2019-12-19 | Discharge: 2019-12-19 | Disposition: A | Discharge status: Home / Self Care | Payer: BC Managed Care – PPO | Attending: Urology | Admitting: Urology

## 2019-12-19 DIAGNOSIS — K219 Gastro-esophageal reflux disease without esophagitis: Secondary | ICD-10-CM | POA: Diagnosis not present

## 2019-12-19 DIAGNOSIS — M1990 Unspecified osteoarthritis, unspecified site: Secondary | ICD-10-CM | POA: Insufficient documentation

## 2019-12-19 DIAGNOSIS — N189 Chronic kidney disease, unspecified: Secondary | ICD-10-CM | POA: Insufficient documentation

## 2019-12-19 DIAGNOSIS — Z87442 Personal history of urinary calculi: Secondary | ICD-10-CM | POA: Diagnosis not present

## 2019-12-19 DIAGNOSIS — N132 Hydronephrosis with renal and ureteral calculous obstruction: Secondary | ICD-10-CM | POA: Insufficient documentation

## 2019-12-19 DIAGNOSIS — J45909 Unspecified asthma, uncomplicated: Secondary | ICD-10-CM | POA: Diagnosis not present

## 2019-12-19 DIAGNOSIS — N200 Calculus of kidney: Secondary | ICD-10-CM

## 2019-12-19 DIAGNOSIS — N201 Calculus of ureter: Secondary | ICD-10-CM

## 2019-12-19 DIAGNOSIS — R1030 Lower abdominal pain, unspecified: Secondary | ICD-10-CM | POA: Diagnosis present

## 2019-12-19 HISTORY — PX: EXTRACORPOREAL SHOCK WAVE LITHOTRIPSY: SHX1557

## 2019-12-19 SURGERY — LITHOTRIPSY, ESWL
Anesthesia: Moderate Sedation | Laterality: Right

## 2019-12-19 MED ORDER — DIPHENHYDRAMINE HCL 25 MG PO CAPS
ORAL_CAPSULE | ORAL | Status: AC
  Administered 2019-12-19: 25 mg via ORAL
  Filled 2019-12-19: qty 1

## 2019-12-19 MED ORDER — DIPHENHYDRAMINE HCL 25 MG PO CAPS: 25.0000 mg | ORAL_CAPSULE | ORAL | Status: AC

## 2019-12-19 MED ORDER — SODIUM CHLORIDE 0.9 % IV SOLN: INTRAVENOUS | Status: DC

## 2019-12-19 MED ORDER — ONDANSETRON HCL 4 MG/2ML IJ SOLN: 4.0000 mg | Freq: Once | INTRAMUSCULAR | Status: AC | PRN

## 2019-12-19 MED ORDER — ONDANSETRON HCL 4 MG/2ML IJ SOLN
INTRAMUSCULAR | Status: AC
  Administered 2019-12-19: 4 mg via INTRAVENOUS
  Filled 2019-12-19: qty 2

## 2019-12-19 MED ORDER — HYDROCODONE-ACETAMINOPHEN 5-325 MG PO TABS: 1.0000 | ORAL_TABLET | ORAL | 0 refills | Status: DC | PRN

## 2019-12-19 MED ORDER — CIPROFLOXACIN HCL 500 MG PO TABS: 500.0000 mg | ORAL_TABLET | ORAL | Status: AC

## 2019-12-19 MED ORDER — CIPROFLOXACIN HCL 500 MG PO TABS
ORAL_TABLET | ORAL | Status: AC
  Administered 2019-12-19: 500 mg via ORAL
  Filled 2019-12-19: qty 1

## 2019-12-19 MED ORDER — DIAZEPAM 5 MG PO TABS: 10.0000 mg | ORAL_TABLET | ORAL | Status: AC

## 2019-12-19 MED ORDER — DIAZEPAM 5 MG PO TABS
ORAL_TABLET | ORAL | Status: AC
  Administered 2019-12-19: 10 mg via ORAL
  Filled 2019-12-19: qty 2

## 2019-12-19 NOTE — Interval H&P Note (Signed)
History and Physical Interval Note:  12/19/2019 8:36 AM  Patrick Cobb  has presented today for surgery, with the diagnosis of KIDNEY STONE.  The various methods of treatment have been discussed with the patient and family. After consideration of risks, benefits and other options for treatment, the patient has consented to  Procedure(s): EXTRACORPOREAL SHOCK WAVE LITHOTRIPSY (ESWL) (Right) as a surgical intervention.  The patient's history has been reviewed, patient examined, no change in status, stable for surgery.  I have reviewed the patient's chart and labs.  Questions were answered to the patient's satisfaction.     [name]

## 2019-12-19 NOTE — Discharge Instructions (Signed)
See [facility] discharge instructions in chart.  AMBULATORY SURGERY  DISCHARGE INSTRUCTIONS   1) The drugs that you were given will stay in your system until tomorrow so for the next 24 hours you should not:  A) Drive an automobile B) Make any legal decisions C) Drink any alcoholic beverage   2) You may resume regular meals tomorrow.  Today it is better to start with liquids and gradually work up to solid foods.  You may eat anything you prefer, but it is better to start with liquids, then soup and crackers, and gradually work up to solid foods.   3) Please notify your doctor immediately if you have any unusual bleeding, trouble breathing, redness and pain at the surgery site, drainage, fever, or pain not relieved by medication.    4) Additional Instructions:        Please contact your physician with any problems or Same Day Surgery at [phone], Monday through Friday 6 am to 4 pm, or [place] [facility] [place] [facility] number at [phone].  

## 2019-12-27 ENCOUNTER — Ambulatory Visit: Payer: BC Managed Care – PPO | Admitting: Physician Assistant

## 2020-01-09 ENCOUNTER — Encounter: Payer: Self-pay | Admitting: Urology

## 2020-01-09 ENCOUNTER — Telehealth: Payer: Self-pay | Admitting: Urology

## 2020-01-09 ENCOUNTER — Ambulatory Visit
Admission: RE | Admit: 2020-01-09 | Discharge: 2020-01-09 | Disposition: A | Discharge status: Home / Self Care | Payer: BC Managed Care – PPO | Source: Ambulatory Visit | Attending: Urology | Admitting: Urology

## 2020-01-09 ENCOUNTER — Ambulatory Visit (INDEPENDENT_AMBULATORY_CARE_PROVIDER_SITE_OTHER): Payer: BC Managed Care – PPO | Admitting: Urology

## 2020-01-09 ENCOUNTER — Other Ambulatory Visit: Payer: Self-pay

## 2020-01-09 VITALS — BP 147/101 | HR 73 | Ht 74.0 in | Wt 287.0 lb

## 2020-01-09 DIAGNOSIS — N200 Calculus of kidney: Secondary | ICD-10-CM

## 2020-01-09 DIAGNOSIS — R3129 Other microscopic hematuria: Secondary | ICD-10-CM

## 2020-01-09 NOTE — Progress Notes (Signed)
01/09/2020 8:42 AM   Patrick Cobb 10/02/1975 [id number]  Referring provider: Adin Hector, MD [facility] [place],  [place] [address]  Chief Complaint  Patient presents with  . Nephrolithiasis    HPI: [name] is a 44 year old male with nephrolithiasis who is status post ESWL who presents today for follow up.  He underwent ESWL on December 19, 2019 for a 7 mm right UPJ stone with Dr. Erlene Quan.  His postprocedural course was as expected and uneventful.  He did capture some sand material in a strainer, but he did not bring that material with him today.  KUB 01/09/2020 No definite nephrolithiasis or ureteral calculus is noted. No evidence of bowel obstruction or ileus.  He has not received the collection materials from [facility] for his 76-HYWV metabolic work-up as of this visit.   PMH: Past Medical History:  Diagnosis Date  . Arthritis   . Chronic kidney disease    kidney stones  . GERD (gastroesophageal reflux disease)   . Nephrolithiasis     Surgical History: Past Surgical History:  Procedure Laterality Date  . EXTRACORPOREAL SHOCK WAVE LITHOTRIPSY Right 12/19/2019   Procedure: EXTRACORPOREAL SHOCK WAVE LITHOTRIPSY (ESWL);  Surgeon: Hollice Espy, MD;  Location: [facility] ORS;  Service: Urology;  Laterality: Right;  . MYRINGOTOMY WITH TUBE PLACEMENT    . SHOULDER ARTHROSCOPY WITH DISTAL CLAVICLE RESECTION Right 10/31/2017   Procedure: SHOULDER ARTHROSCOPY WITH DISTAL CLAVICLE EXCISION, SUBACROMIAL DECOMPRESSION, EXTENSIVE DEBRIDEMENT;  Surgeon: Leim Fabry, MD;  Location: [place];  Service: Orthopedics;  Laterality: Right;  MAC W INTERSCALENE BLOCK BEACH CHAIR W/ SPYDER SIMPLE SLING  . URETEROSCOPY WITH HOLMIUM LASER LITHOTRIPSY  2012    Home Medications:  Allergies as of 01/09/2020      Reactions   Other    Other reaction(s): Other (See Comments) Flu like symptoms to tetanus shot      Medication List       Accurate as of January 09, 2020  8:42 AM. If you have any questions, ask your nurse or doctor.        cholecalciferol 10 MCG (400 UNIT) Tabs tablet Commonly known as: VITAMIN D3 Take 400 Units by mouth.   gabapentin 100 MG capsule Commonly known as: NEURONTIN Take 100 mg by mouth 5 (five) times daily.   hydrochlorothiazide 25 MG tablet Commonly known as: HYDRODIURIL Take 25 mg by mouth daily.   HYDROcodone-acetaminophen 5-325 MG tablet Commonly known as: Norco Take 1-2 tablets by mouth every 4 (four) hours as needed for moderate pain or severe pain.   magnesium 30 MG tablet Take 30 mg by mouth 2 (two) times daily.   meloxicam 15 MG tablet Commonly known as: MOBIC Take 15 mg by mouth daily.   montelukast 10 MG tablet Commonly known as: SINGULAIR Take 10 mg by mouth daily.   omeprazole 40 MG capsule Commonly known as: PRILOSEC Take 40 mg by mouth daily.   potassium phosphate (monobasic) 500 MG tablet Commonly known as: K-PHOS ORIGINAL Take 500 mg by mouth 4 (four) times daily -  with meals and at bedtime.   sertraline 50 MG tablet Commonly known as: ZOLOFT Take 50 mg by mouth daily.   tamsulosin 0.4 MG Caps capsule Commonly known as: FLOMAX Take 0.4 mg by mouth daily.   traMADol 50 MG tablet Commonly known as: ULTRAM TAKE 1 2 TABS BY MOUTH EVERY 8HRS AS NEEDED FOR SEVERE PAIN       Allergies:  Allergies  Allergen Reactions  . Other     Other reaction(s): Other (See Comments) Flu like symptoms to tetanus shot    Family History: Family History  Problem Relation Age of Onset  . Bladder Cancer Neg Hx   . Prostate cancer Neg Hx   . Kidney cancer Neg Hx     Social History:  reports that he has never smoked. He has never used smokeless tobacco. He reports that he does not drink alcohol or use drugs.  ROS: Pertinent ROS in HPI  Physical Exam: BP (!) 147/101   Pulse 73   Ht _0  (1.88 m)   Wt 287 lb (130.2 kg)   BMI 36.85 kg/m   Constitutional:   Well nourished. Alert and oriented, No acute distress. HEENT: [place] AT, mask in place.  Trachea midline. Cardiovascular: No clubbing, cyanosis, or edema. Respiratory: Normal respiratory effort, no increased work of breathing. Neurologic: Grossly intact, no focal deficits, moving all 4 extremities. Psychiatric: Normal mood and affect.  Laboratory Data: Lab Results  Component Value Date   WBC 27.5 (H) 09/16/2018   HGB 14.5 09/16/2018   HCT 43.2 09/16/2018   MCV 85.7 09/16/2018   PLT 201 09/16/2018    Lab Results  Component Value Date   CREATININE 0.91 09/16/2018    Urinalysis    Component Value Date/Time   APPEARANCEUR Clear 12/12/2019 0851   GLUCOSEU Negative 12/12/2019 0851   BILIRUBINUR Negative 12/12/2019 0851   PROTEINUR Negative 12/12/2019 0851   NITRITE Negative 12/12/2019 0851   LEUKOCYTESUR Trace (A) 12/12/2019 0851    I have reviewed the labs.   Pertinent Imaging: CLINICAL DATA:  Status post lithotripsy.  EXAM: ABDOMEN - 1 VIEW  COMPARISON:  December 19, 2019.  FINDINGS: The bowel gas pattern is normal. No radio-opaque calculi or other significant radiographic abnormality are seen.  IMPRESSION: No definite nephrolithiasis or ureteral calculus is noted. No evidence of bowel obstruction or ileus.   Electronically Signed   By: Marijo Conception M.D.   On: 01/09/2020 09:46 I have independently reviewed the films and do not identify a calculus.   Assessment & Plan:    1. Nephrolithiasis Patient is status post ESWL doing well He will try to remember to bring the fragments in with his next appointment for analysis We will schedule renal ultrasound to evaluate for iatrogenic hydronephrosis He will return for his renal ultrasound report and recheck UA to ensure hematuria has cleared He states the 92-JJHE metabolic work-up has been ordered, but he has not received the collection materials and we will check on this for him  Return for RUS report and UA  .  These notes generated with voice recognition software. I apologize for typographical errors.  Zara Council, PA-C  [facility] [address]  [place], [place] [address] [phone]

## 2020-01-09 NOTE — Telephone Encounter (Signed)
Patrick Cobb stated that a Litholink was ordered for him, but he has not received the materials.  Would you check on this for him?

## 2020-01-13 ENCOUNTER — Other Ambulatory Visit: Payer: Self-pay | Admitting: Internal Medicine

## 2020-01-13 DIAGNOSIS — R519 Headache, unspecified: Secondary | ICD-10-CM

## 2020-01-21 ENCOUNTER — Other Ambulatory Visit: Payer: Self-pay

## 2020-01-21 ENCOUNTER — Ambulatory Visit
Admission: RE | Admit: 2020-01-21 | Discharge: 2020-01-21 | Disposition: A | Discharge status: Home / Self Care | Payer: BC Managed Care – PPO | Source: Ambulatory Visit | Attending: Urology | Admitting: Urology

## 2020-01-21 ENCOUNTER — Ambulatory Visit
Admission: RE | Admit: 2020-01-21 | Discharge: 2020-01-21 | Disposition: A | Discharge status: Home / Self Care | Payer: BC Managed Care – PPO | Source: Ambulatory Visit | Attending: Internal Medicine | Admitting: Internal Medicine

## 2020-01-21 DIAGNOSIS — R519 Headache, unspecified: Secondary | ICD-10-CM | POA: Diagnosis not present

## 2020-01-21 DIAGNOSIS — R3129 Other microscopic hematuria: Secondary | ICD-10-CM | POA: Insufficient documentation

## 2020-02-13 NOTE — Progress Notes (Addendum)
02/14/20 9:29 AM   Patrick Cobb 07/26/1976 [id number]  Referring provider: Adin Hector, MD [facility] [place],  [place] [address] Chief Complaint  Patient presents with  . Nephrolithiasis    HPI: Patrick Cobb is a 44 y.o. with nephrolithiasis who is status post ESWL who presents today for follow up.  CT from 12/12/19 revealed a 7 mm right UPJ stone and 1 mm nonobstructing stone in interpolar left kidney.   He underwent ESWL on December 19, 2019 for a 7 mm right UPJ stone with Dr. Erlene Quan.  His postprocedural course was as expected and uneventful.  He did capture some sand material in a strainer, but he did not bring that material with him today.  KUB 01/09/2020 No definite nephrolithiasis or ureteral calculus is noted. No evidence of bowel obstruction or ileus.  He has not received the collection materials from [facility] for his 63-ZCHY metabolic work-up as of this visit.  RUS from 01/22/20 unremarkable w/ no hydronephrosis.   No GU complaints. He brought in his stone for analysis today. Denies gross hematuria.   His UA today unremarkable.   PMH: Past Medical History:  Diagnosis Date  . Arthritis   . Chronic kidney disease    kidney stones  . GERD (gastroesophageal reflux disease)   . Nephrolithiasis     Surgical History: Past Surgical History:  Procedure Laterality Date  . EXTRACORPOREAL SHOCK WAVE LITHOTRIPSY Right 12/19/2019   Procedure: EXTRACORPOREAL SHOCK WAVE LITHOTRIPSY (ESWL);  Surgeon: Hollice Espy, MD;  Location: [facility] ORS;  Service: Urology;  Laterality: Right;  . MYRINGOTOMY WITH TUBE PLACEMENT    . SHOULDER ARTHROSCOPY WITH DISTAL CLAVICLE RESECTION Right 10/31/2017   Procedure: SHOULDER ARTHROSCOPY WITH DISTAL CLAVICLE EXCISION, SUBACROMIAL DECOMPRESSION, EXTENSIVE DEBRIDEMENT;  Surgeon: Leim Fabry, MD;  Location: [place];  Service: Orthopedics;  Laterality: Right;  MAC W INTERSCALENE BLOCK BEACH CHAIR  W/ SPYDER SIMPLE SLING  . URETEROSCOPY WITH HOLMIUM LASER LITHOTRIPSY  2012    Home Medications:  Allergies as of 02/14/2020      Reactions   Other    Other reaction(s): Other (See Comments) Flu like symptoms to tetanus shot      Medication List       Accurate as of February 14, 2020  9:29 AM. If you have any questions, ask your nurse or doctor.        STOP taking these medications   HYDROcodone-acetaminophen 5-325 MG tablet Commonly known as: Norco Stopped by: Zara Council, PA-C     TAKE these medications   ALPRAZolam 0.25 MG tablet Commonly known as: XANAX Take by mouth.   cholecalciferol 10 MCG (400 UNIT) Tabs tablet Commonly known as: VITAMIN D3 Take 400 Units by mouth.   gabapentin 100 MG capsule Commonly known as: NEURONTIN Take 100 mg by mouth 5 (five) times daily. What changed: Another medication with the same name was removed. Continue taking this medication, and follow the directions you see here. Changed by: Zara Council, PA-C   hydrochlorothiazide 25 MG tablet Commonly known as: HYDRODIURIL Take 25 mg by mouth daily.   magnesium 30 MG tablet Take 30 mg by mouth 2 (two) times daily.   meloxicam 15 MG tablet Commonly known as: MOBIC Take 15 mg by mouth daily.   montelukast 10 MG tablet Commonly known as: SINGULAIR Take 10 mg by mouth daily.   omeprazole 40 MG capsule Commonly known as: PRILOSEC Take 40 mg by mouth daily.   potassium  phosphate (monobasic) 500 MG tablet Commonly known as: K-PHOS ORIGINAL Take 500 mg by mouth 4 (four) times daily -  with meals and at bedtime.   prochlorperazine 10 MG tablet Commonly known as: COMPAZINE Take by mouth.   sertraline 50 MG tablet Commonly known as: ZOLOFT Take 50 mg by mouth daily.   tamsulosin 0.4 MG Caps capsule Commonly known as: FLOMAX Take 0.4 mg by mouth daily.   topiramate 25 MG tablet Commonly known as: TOPAMAX Take 25 mg by mouth 2 (two) times daily.   traMADol 50 MG  tablet Commonly known as: ULTRAM TAKE 1 2 TABS BY MOUTH EVERY 8HRS AS NEEDED FOR SEVERE PAIN       Allergies:  Allergies  Allergen Reactions  . Other     Other reaction(s): Other (See Comments) Flu like symptoms to tetanus shot    Family History: Family History  Problem Relation Age of Onset  . Bladder Cancer Neg Hx   . Prostate cancer Neg Hx   . Kidney cancer Neg Hx     Social History:  reports that he has never smoked. He has never used smokeless tobacco. He reports that he does not drink alcohol or use drugs.  ROS For pertinent review of systems please refer to history of present illness   Physical Exam: BP (!) 152/98   Pulse 68   Ht _0  (1.88 m)   Wt 285 lb (129.3 kg)   BMI 36.59 kg/m   Constitutional:  Alert and oriented, No acute distress. HEENT: [place] AT, moist mucus membranes.  Trachea midline, no masses. Cardiovascular: No clubbing, cyanosis, or edema. Respiratory: Normal respiratory effort, no increased work of breathing. Skin: No rashes, bruises or suspicious lesions. Neurologic: Grossly intact, no focal deficits, moving all 4 extremities. Psychiatric: Normal mood and affect.  Laboratory Data:  Urinalysis UA unremarkable   Pertinent Imaging:  Results for orders placed during the hospital encounter of 01/21/20  US RENAL   Narrative CLINICAL DATA:  44 year old male with a history of microscopic hematuria  EXAM: RENAL / URINARY TRACT ULTRASOUND COMPLETE  COMPARISON:  None.  FINDINGS: Right Kidney:  Length: 12.2 cm x 5.8 cm x 5.6 cm, 207 cc. Echogenicity within normal limits. No mass or hydronephrosis visualized.  Left Kidney:  Length: 13.3 cm x 5.8 cm x 5.0 cm, 205 cc. Echogenicity within normal limits. No mass or hydronephrosis visualized.  Bladder:  Appears normal for degree of bladder distention.  IMPRESSION: Unremarkable sonographic survey of the kidneys with no hydronephrosis.   Electronically Signed   By: Corrie Mckusick  D.O.   On: 01/22/2020 08:55    I have personally reviewed the images and agree with radiologist interpretation.   Assessment & Plan:    1. Nephrolithiasis  S/p ESWL  RUS from 01/22/20 unremarkable w/ no hydronephrosis  UA today unremarkable w/ no microscopic blood  He brought in his stones for analysis today; sent for stone analysis    Return for pending Litho Link results.  [place] [place] [address] [place], [place] [address] [phone]  I, Lucas Mallow, am acting as a Education administrator for [employer],  I have reviewed the above documentation for accuracy and completeness, and I agree with the above.    Zara Council, PA-C

## 2020-02-14 ENCOUNTER — Ambulatory Visit (INDEPENDENT_AMBULATORY_CARE_PROVIDER_SITE_OTHER): Payer: BC Managed Care – PPO | Admitting: Urology

## 2020-02-14 ENCOUNTER — Encounter: Payer: Self-pay | Admitting: Urology

## 2020-02-14 ENCOUNTER — Other Ambulatory Visit: Payer: Self-pay

## 2020-02-14 VITALS — BP 152/98 | HR 68 | Ht 74.0 in | Wt 285.0 lb

## 2020-02-14 DIAGNOSIS — N200 Calculus of kidney: Secondary | ICD-10-CM

## 2020-02-14 LAB — MICROSCOPIC EXAMINATION: Bacteria, UA: NONE SEEN

## 2020-02-14 LAB — URINALYSIS, COMPLETE
Bilirubin, UA: NEGATIVE
Glucose, UA: NEGATIVE
Ketones, UA: NEGATIVE
Leukocytes,UA: NEGATIVE
Nitrite, UA: NEGATIVE
Protein,UA: NEGATIVE
RBC, UA: NEGATIVE
Specific Gravity, UA: 1.025 (ref 1.005–1.030)
Urobilinogen, Ur: 0.2 mg/dL (ref 0.2–1.0)
pH, UA: 7 (ref 5.0–7.5)

## 2020-02-21 LAB — CALCULI, WITH PHOTOGRAPH (CLINICAL LAB)
Calcium Oxalate Dihydrate: 80 %
Calcium Oxalate Monohydrate: 15 %
Hydroxyapatite: 5 %
Weight Calculi: 5 mg

## 2020-04-13 ENCOUNTER — Telehealth: Payer: Self-pay | Admitting: Urology

## 2020-04-13 NOTE — Telephone Encounter (Signed)
LMOM to ask if he has completed Litholink. Also sent a Mychart message.

## 2020-04-13 NOTE — Telephone Encounter (Signed)
Would you call Mr. Vernon and ask if he is completed his Litholink?

## 2021-04-29 ENCOUNTER — Ambulatory Visit: Payer: BC Managed Care – PPO

## 2021-10-20 HISTORY — PX: ESOPHAGOGASTRODUODENOSCOPY: SHX1529

## 2021-10-22 ENCOUNTER — Telehealth: Payer: BC Managed Care – PPO | Admitting: Nurse Practitioner

## 2021-10-22 DIAGNOSIS — R5081 Fever presenting with conditions classified elsewhere: Secondary | ICD-10-CM | POA: Diagnosis not present

## 2021-10-22 DIAGNOSIS — J069 Acute upper respiratory infection, unspecified: Secondary | ICD-10-CM | POA: Diagnosis not present

## 2021-10-22 MED ORDER — LEVOFLOXACIN 500 MG PO TABS: 500.0000 mg | ORAL_TABLET | Freq: Every day | ORAL | 0 refills | Status: DC

## 2021-10-22 NOTE — Patient Instructions (Signed)

## 2021-10-22 NOTE — Progress Notes (Signed)
Virtual Visit Consent   Patrick Cobb, you are scheduled for a virtual visit with Mary-Margaret Daphine Deutscher, FNP, a [facility] provider, today.     Just as with appointments in the office, your consent must be obtained to participate.  Your consent will be active for this visit and any virtual visit you may have with one of our providers in the next 365 days.     If you have a MyChart account, a copy of this consent can be sent to you electronically.  All virtual visits are billed to your insurance company just like a traditional visit in the office.    As this is a virtual visit, video technology does not allow for your provider to perform a traditional examination.  This may limit your provider's ability to fully assess your condition.  If your provider identifies any concerns that need to be evaluated in person or the need to arrange testing (such as labs, EKG, etc.), we will make arrangements to do so.     Although advances in technology are sophisticated, we cannot ensure that it will always work on either your end or our end.  If the connection with a video visit is poor, the visit may have to be switched to a telephone visit.  With either a video or telephone visit, we are not always able to ensure that we have a secure connection.     I need to obtain your verbal consent now.   Are you willing to proceed with your visit today? YES   [name] has provided verbal consent on 10/22/2021 for a virtual visit (video or telephone).   Mary-Margaret Daphine Deutscher, FNP   Date: 10/22/2021 6:52 PM   Virtual Visit via Video Note   I, Mary-Margaret Daphine Deutscher, connected with Patrick Cobb ([id number], 03/05/1975) on 10/22/21 at  7:15 PM EST by a video-enabled telemedicine application and verified that I am speaking with the correct person using two identifiers.  Location: Patient: Virtual Visit Location Patient: Home Provider: Virtual Visit Location Provider: Mobile   I  discussed the limitations of evaluation and management by telemedicine and the availability of in person appointments. The patient expressed understanding and agreed to proceed.    History of Present Illness: Patrick Cobb is a 46 y.o. who identifies as a male who was assigned male at birth, and is being seen today for uri .  HPI: URI  This is a new problem. The problem has been waxing and waning. The maximum temperature recorded prior to his arrival was 100.4 - 100.9 F. Associated symptoms include chest pain, congestion, headaches, rhinorrhea and sneezing.  He had covid back in December. Has had cough an docngestion intermittently. Has been on  2 rounds and prednisone since he had covid. He will get better then comes right back. Just finished and antibiotic this past Monday.  Review of Systems  Constitutional:  Positive for fever (100.3).  HENT:  Positive for congestion, rhinorrhea and sneezing.   Cardiovascular:  Positive for chest pain.  Neurological:  Positive for headaches.   Problems: There are no problems to display for this patient.   Allergies: Not on File Medications: No current outpatient medications on file.  Observations/Objective: Patient is well-developed, well-nourished in no acute distress.  Resting comfortably  at home.  Head is normocephalic, atraumatic.  No labored breathing.  Speech is clear and coherent with logical content.  Patient is alert and oriented at baseline.  Raspy voice  Assessment and Plan:  Lisa Roca in today with chief complaint of No chief complaint on file.   1. URI with cough and congestion 2. Fever in other diseases 1. Take meds as prescribed 2. Use a cool mist humidifier especially during the winter months and when heat has been humid. 3. Use saline nose sprays frequently 4. Saline irrigations of the nose can be very helpful if done frequently.  * 4X daily for 1 week*  * Use of a nettie pot can be helpful with  this. Follow directions with this* 5. Drink plenty of fluids 6. Keep thermostat turn down low 7.For any cough or congestion- tessalon perles as previously 8. For fever or aces or pains- take tylenol or ibuprofen appropriate for age and weight.  * for fevers greater than 101 orally you may alternate ibuprofen and tylenol every  3 hours.   Needs referrla to ENT- please see PCP for referral  Meds ordered this encounter  Medications   levofloxacin (LEVAQUIN) 500 MG tablet    Sig: Take 1 tablet (500 mg total) by mouth daily.    Dispense:  7 tablet    Refill:  0    Order Specific Question:   Supervising Provider    Answer:   Eber Hong [[id number]]      Follow Up Instructions: I discussed the assessment and treatment plan with the patient. The patient was provided an opportunity to ask questions and all were answered. The patient agreed with the plan and demonstrated an understanding of the instructions.  A copy of instructions were sent to the patient via MyChart.  The patient was advised to call back or seek an in-person evaluation if the symptoms worsen or if the condition fails to improve as anticipated.  Time:  I spent 8 minutes with the patient via telehealth technology discussing the above problems/concerns.    Mary-Margaret Daphine Deutscher, FNP

## 2021-10-25 ENCOUNTER — Encounter: Payer: Self-pay | Admitting: Urology

## 2021-11-03 ENCOUNTER — Other Ambulatory Visit: Payer: Self-pay | Admitting: Pulmonary Disease

## 2021-11-03 ENCOUNTER — Ambulatory Visit
Admission: RE | Admit: 2021-11-03 | Discharge: 2021-11-03 | Disposition: A | Discharge status: Home / Self Care | Payer: BC Managed Care – PPO | Source: Ambulatory Visit | Attending: Pulmonary Disease | Admitting: Pulmonary Disease

## 2021-11-03 DIAGNOSIS — R7989 Other specified abnormal findings of blood chemistry: Secondary | ICD-10-CM | POA: Diagnosis not present

## 2021-11-03 MED ORDER — IOHEXOL 350 MG/ML SOLN
75.0000 mL | Freq: Once | INTRAVENOUS | Status: AC | PRN
  Administered 2021-11-03: 75 mL via INTRAVENOUS

## 2021-12-16 ENCOUNTER — Other Ambulatory Visit: Payer: Self-pay | Admitting: Pulmonary Disease

## 2021-12-16 DIAGNOSIS — J4550 Severe persistent asthma, uncomplicated: Secondary | ICD-10-CM

## 2021-12-27 ENCOUNTER — Other Ambulatory Visit: Payer: Self-pay | Admitting: Pulmonary Disease

## 2021-12-27 DIAGNOSIS — J4550 Severe persistent asthma, uncomplicated: Secondary | ICD-10-CM

## 2022-01-05 ENCOUNTER — Ambulatory Visit: Payer: BC Managed Care – PPO | Attending: Pulmonary Disease

## 2022-01-05 DIAGNOSIS — J4550 Severe persistent asthma, uncomplicated: Secondary | ICD-10-CM

## 2022-01-05 MED ORDER — METHACHOLINE 4 MG/ML NEB SOLN
3.0000 mL | Freq: Once | RESPIRATORY_TRACT | Status: AC
  Filled 2022-01-05: qty 3

## 2022-01-05 MED ORDER — METHACHOLINE 0.25 MG/ML NEB SOLN
3.0000 mL | Freq: Once | RESPIRATORY_TRACT | Status: AC
  Filled 2022-01-05: qty 3

## 2022-01-05 MED ORDER — METHACHOLINE 0 MG/ML NEB SOLN
3.0000 mL | Freq: Once | RESPIRATORY_TRACT | Status: AC
  Filled 2022-01-05: qty 3

## 2022-01-05 MED ORDER — METHACHOLINE 0.0625 MG/ML NEB SOLN
3.0000 mL | Freq: Once | RESPIRATORY_TRACT | Status: AC
  Filled 2022-01-05: qty 3

## 2022-01-05 MED ORDER — ALBUTEROL SULFATE (2.5 MG/3ML) 0.083% IN NEBU
2.5000 mg | INHALATION_SOLUTION | Freq: Once | RESPIRATORY_TRACT | Status: AC
  Filled 2022-01-05: qty 3

## 2022-01-05 MED ORDER — METHACHOLINE 16 MG/ML NEB SOLN
3.0000 mL | Freq: Once | RESPIRATORY_TRACT | Status: AC
  Filled 2022-01-05: qty 3

## 2022-01-05 MED ORDER — METHACHOLINE 1 MG/ML NEB SOLN
3.0000 mL | Freq: Once | RESPIRATORY_TRACT | Status: AC
  Filled 2022-01-05: qty 3

## 2022-01-06 ENCOUNTER — Other Ambulatory Visit: Payer: Self-pay | Admitting: Pulmonary Disease

## 2022-01-06 DIAGNOSIS — J4550 Severe persistent asthma, uncomplicated: Secondary | ICD-10-CM

## 2022-01-26 ENCOUNTER — Ambulatory Visit: Payer: BC Managed Care – PPO | Attending: Pulmonary Disease

## 2022-01-26 DIAGNOSIS — J4550 Severe persistent asthma, uncomplicated: Secondary | ICD-10-CM | POA: Diagnosis present

## 2022-01-26 MED ORDER — METHACHOLINE 0.25 MG/ML NEB SOLN
3.0000 mL | Freq: Once | RESPIRATORY_TRACT | Status: AC
  Administered 2022-01-26: 0.75 mg via RESPIRATORY_TRACT
  Filled 2022-01-26: qty 3

## 2022-01-26 MED ORDER — METHACHOLINE 1 MG/ML NEB SOLN
3.0000 mL | Freq: Once | RESPIRATORY_TRACT | Status: AC
  Administered 2022-01-26: 3 mg via RESPIRATORY_TRACT
  Filled 2022-01-26: qty 3

## 2022-01-26 MED ORDER — ALBUTEROL SULFATE (2.5 MG/3ML) 0.083% IN NEBU
2.5000 mg | INHALATION_SOLUTION | Freq: Once | RESPIRATORY_TRACT | Status: AC
  Administered 2022-01-26: 2.5 mg via RESPIRATORY_TRACT
  Filled 2022-01-26: qty 3

## 2022-01-26 MED ORDER — METHACHOLINE 16 MG/ML NEB SOLN
3.0000 mL | Freq: Once | RESPIRATORY_TRACT | Status: AC
  Filled 2022-01-26: qty 3

## 2022-01-26 MED ORDER — METHACHOLINE 0 MG/ML NEB SOLN
3.0000 mL | Freq: Once | RESPIRATORY_TRACT | Status: AC
  Administered 2022-01-26: 3 mL via RESPIRATORY_TRACT
  Filled 2022-01-26: qty 3

## 2022-01-26 MED ORDER — METHACHOLINE 4 MG/ML NEB SOLN
3.0000 mL | Freq: Once | RESPIRATORY_TRACT | Status: AC
  Filled 2022-01-26: qty 3

## 2022-01-26 MED ORDER — METHACHOLINE 0.0625 MG/ML NEB SOLN
3.0000 mL | Freq: Once | RESPIRATORY_TRACT | Status: AC
  Administered 2022-01-26: 0.1875 mg via RESPIRATORY_TRACT
  Filled 2022-01-26: qty 3

## 2022-04-07 LAB — PULMONARY FUNCTION TEST ARMC ONLY
FEF 25-75 Post: 3.15 L/sec
FEF 25-75 Pre: 2.39 L/sec
FEF2575-%Change-Post: 31 %
FEF2575-%Pred-Post: 73 %
FEF2575-%Pred-Pre: 55 %
FEV1-%Change-Post: 5 %
FEV1-%Pred-Post: 63 %
FEV1-%Pred-Pre: 60 %
FEV1-Post: 3.06 L
FEV1-Pre: 2.91 L
FEV1FVC-%Change-Post: 7 %
FEV1FVC-%Pred-Pre: 97 %
FEV6-%Change-Post: -2 %
FEV6-%Pred-Post: 62 %
FEV6-%Pred-Pre: 63 %
FEV6-Post: 3.69 L
FEV6-Pre: 3.77 L
FEV6FVC-%Pred-Post: 102 %
FEV6FVC-%Pred-Pre: 102 %
FVC-%Change-Post: -2 %
FVC-%Pred-Post: 60 %
FVC-%Pred-Pre: 61 %
FVC-Post: 3.69 L
FVC-Pre: 3.77 L
Post FEV1/FVC ratio: 83 %
Post FEV6/FVC ratio: 100 %
Pre FEV1/FVC ratio: 77 %
Pre FEV6/FVC Ratio: 100 %

## 2022-07-07 ENCOUNTER — Ambulatory Visit (INDEPENDENT_AMBULATORY_CARE_PROVIDER_SITE_OTHER): Payer: BC Managed Care – PPO

## 2022-07-07 ENCOUNTER — Ambulatory Visit
Admit: 2022-07-07 | Discharge: 2022-07-07 | Disposition: A | Discharge status: Home / Self Care | Payer: BC Managed Care – PPO

## 2022-07-07 ENCOUNTER — Encounter: Payer: Self-pay | Admitting: Emergency Medicine

## 2022-07-07 ENCOUNTER — Emergency Department
Admission: EM | Admit: 2022-07-07 | Discharge: 2022-07-07 | Disposition: A | Discharge status: Home / Self Care | Payer: BC Managed Care – PPO | Attending: Emergency Medicine | Admitting: Emergency Medicine

## 2022-07-07 ENCOUNTER — Ambulatory Visit
Admission: RE | Admit: 2022-07-07 | Discharge: 2022-07-07 | Payer: BC Managed Care – PPO | Source: Ambulatory Visit | Attending: Family Medicine | Admitting: Family Medicine

## 2022-07-07 ENCOUNTER — Other Ambulatory Visit: Payer: Self-pay

## 2022-07-07 VITALS — BP 131/82 | HR 53 | Temp 97.7°F | Resp 18

## 2022-07-07 DIAGNOSIS — N200 Calculus of kidney: Secondary | ICD-10-CM

## 2022-07-07 DIAGNOSIS — D72829 Elevated white blood cell count, unspecified: Secondary | ICD-10-CM

## 2022-07-07 DIAGNOSIS — R109 Unspecified abdominal pain: Secondary | ICD-10-CM

## 2022-07-07 DIAGNOSIS — N132 Hydronephrosis with renal and ureteral calculous obstruction: Secondary | ICD-10-CM | POA: Diagnosis not present

## 2022-07-07 DIAGNOSIS — N202 Calculus of kidney with calculus of ureter: Secondary | ICD-10-CM | POA: Diagnosis not present

## 2022-07-07 DIAGNOSIS — M545 Low back pain, unspecified: Secondary | ICD-10-CM | POA: Diagnosis present

## 2022-07-07 LAB — CBC WITH DIFFERENTIAL/PLATELET
Abs Immature Granulocytes: 0.07 10*3/uL (ref 0.00–0.07)
Basophils Absolute: 0 10*3/uL (ref 0.0–0.1)
Basophils Relative: 0 %
Eosinophils Absolute: 0 10*3/uL (ref 0.0–0.5)
Eosinophils Relative: 0 %
HCT: 44.1 % (ref 39.0–52.0)
Hemoglobin: 14.4 g/dL (ref 13.0–17.0)
Immature Granulocytes: 0 %
Lymphocytes Relative: 11 %
Lymphs Abs: 1.7 10*3/uL (ref 0.7–4.0)
MCH: 28.7 pg (ref 26.0–34.0)
MCHC: 32.7 g/dL (ref 30.0–36.0)
MCV: 88 fL (ref 80.0–100.0)
Monocytes Absolute: 0.8 10*3/uL (ref 0.1–1.0)
Monocytes Relative: 5 %
Neutro Abs: 13.3 10*3/uL — ABNORMAL HIGH (ref 1.7–7.7)
Neutrophils Relative %: 84 %
Platelets: 249 10*3/uL (ref 150–400)
RBC: 5.01 MIL/uL (ref 4.22–5.81)
RDW: 13.7 % (ref 11.5–15.5)
WBC: 16 10*3/uL — ABNORMAL HIGH (ref 4.0–10.5)
nRBC: 0 % (ref 0.0–0.2)

## 2022-07-07 LAB — URINALYSIS, ROUTINE W REFLEX MICROSCOPIC
Bilirubin Urine: NEGATIVE
Glucose, UA: NEGATIVE mg/dL
Ketones, ur: NEGATIVE mg/dL
Leukocytes,Ua: NEGATIVE
Nitrite: NEGATIVE
Specific Gravity, Urine: 1.02 (ref 1.005–1.030)
pH: 6 (ref 5.0–8.0)

## 2022-07-07 LAB — URINALYSIS, MICROSCOPIC (REFLEX): RBC / HPF: >50 RBC/hpf (ref 0–5)

## 2022-07-07 LAB — BASIC METABOLIC PANEL
Anion gap: 5 (ref 5–15)
BUN: 30 mg/dL — ABNORMAL HIGH (ref 6–20)
CO2: 23 mmol/L (ref 22–32)
Calcium: 9.3 mg/dL (ref 8.9–10.3)
Chloride: 110 mmol/L (ref 98–111)
Creatinine, Ser: 1.13 mg/dL (ref 0.61–1.24)
GFR, Estimated: >60 mL/min (ref >60)
Glucose, Bld: 140 mg/dL — ABNORMAL HIGH (ref 70–99)
Potassium: 4.2 mmol/L (ref 3.5–5.1)
Sodium: 138 mmol/L (ref 135–145)

## 2022-07-07 MED ORDER — ONDANSETRON HCL 4 MG PO TABS: 4.0000 mg | ORAL_TABLET | Freq: Three times a day (TID) | ORAL | 0 refills | Status: DC | PRN

## 2022-07-07 MED ORDER — KETOROLAC TROMETHAMINE 10 MG PO TABS: 10.0000 mg | ORAL_TABLET | Freq: Three times a day (TID) | ORAL | 0 refills | Status: DC | PRN

## 2022-07-07 MED ORDER — PROMETHAZINE HCL 25 MG/ML IJ SOLN
25.0000 mg | Freq: Once | INTRAMUSCULAR | Status: AC
  Administered 2022-07-07: 25 mg via INTRAMUSCULAR

## 2022-07-07 MED ORDER — KETOROLAC TROMETHAMINE 60 MG/2ML IM SOLN
30.0000 mg | Freq: Once | INTRAMUSCULAR | Status: AC
  Administered 2022-07-07: 30 mg via INTRAMUSCULAR

## 2022-07-07 NOTE — ED Triage Notes (Signed)
Pt presents with sharp left side lower back pain that started last night. Pt reports a history of kidney stones.

## 2022-07-07 NOTE — Discharge Instructions (Signed)
Please seek medical attention for any high fevers, chest pain, shortness of breath, change in behavior, persistent vomiting, bloody stool or any other new or concerning symptoms.  

## 2022-07-07 NOTE — ED Notes (Signed)
Patient is being discharged from the Urgent Care and sent to the Emergency Department via PV . Per Dr. Susa Simmonds, patient is in need of higher level of care due to hydronephrosis with urinary obstruction due to kidney stone. Patient is aware and verbalizes understanding of plan of care.  Vitals:   07/07/22 1002  BP: 131/82  Pulse: (!) 53  Resp: 18  Temp: 97.7 F (36.5 C)  SpO2: 98%

## 2022-07-07 NOTE — Discharge Instructions (Addendum)
Your CT was concerning for an obstructing kidney stone in the left urinary tract you also have a 94mm stone in the right kidney.  There was a cyst on your right lower kidney that will need to be followed up by your urologist or PCP. You also have an elevated white blood cell count which is a marker for inflammation or infection.    You have been advised to follow up immediately in the emergency department for concerning signs or symptoms as discussed during your visit. If you declined EMS transport, please have a family member take you directly to the ED at this time. Do not delay.   Based on concerns about condition, if you do not follow up in the ED, you may risk poor outcomes including worsening of condition, delayed treatment and potentially life threatening issues. If you have declined to go to the ED at this time, you should call your PCP immediately to set up a follow up appointment.   Go to ED for red flag symptoms, including; fevers you cannot reduce with Tylenol/Motrin, severe headaches, vision changes, numbness/weakness in part of the body, lethargy, confusion, intractable vomiting, severe dehydration, chest pain, breathing difficulty, severe persistent abdominal or pelvic pain, signs of severe infection (increased redness, swelling of an area), feeling faint or passing out, dizziness, etc. You should especially go to the ED for sudden acute worsening of condition if you do not elect to go at this time.

## 2022-07-07 NOTE — ED Provider Notes (Addendum)
[facility]    CSN: [id number] Arrival date & time: 07/07/22  0915      History   Chief Complaint Chief Complaint  Patient presents with   Back Pain    HPI Patrick Cobb is a 46 y.o. male.   HPI  [name] presents for abdominal pain that started 2 days ago and then he started having left-sided lower back pain last night.  He has history of recurrent kidney stones and follows with urology.  He has not seen any hematuria or had any dysuria.  He states that his urine was somewhat cloudy today.  He is not concerned with STIs.  Symptoms feel similar to when he had kidney stones in the past.  Nothing taken for pain prior to arrival.  Has consistent nausea and vomited while in the urgent care today.  Denies any urethral discharge.  Slight testicular pain.  There has been no fever or chills.  He called both his urologist and his PCP who were unable to see him today.  He was instructed to go to the urgent care.   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   Vitamin D deficiency 12/12/2019   Mild intermittent asthma without complication 84/16/6063   History of hemorrhoids 05/04/2018   Chronic right shoulder pain 07/09/2017   Bilateral leg edema 06/17/2016   DDD (degenerative disc disease), lumbar 06/11/2014   Polyneuropathy associated with underlying disease ([place]) 06/11/2014    Past Surgical History:  Procedure Laterality Date   EXTRACORPOREAL SHOCK WAVE LITHOTRIPSY Right 12/19/2019   Procedure: EXTRACORPOREAL SHOCK WAVE LITHOTRIPSY (ESWL);  Surgeon: Hollice Espy, MD;  Location: [facility];  Service: Urology;  Laterality: Right;    MYRINGOTOMY WITH TUBE PLACEMENT     SHOULDER ARTHROSCOPY WITH DISTAL CLAVICLE RESECTION Right 10/31/2017   Procedure: SHOULDER ARTHROSCOPY WITH DISTAL CLAVICLE EXCISION, SUBACROMIAL DECOMPRESSION, EXTENSIVE DEBRIDEMENT;  Surgeon: Leim Fabry, MD;  Location: [facility];  Service: Orthopedics;  Laterality: Right;  MAC W INTERSCALENE BLOCK BEACH CHAIR W/ SPYDER SIMPLE SLING   URETEROSCOPY WITH HOLMIUM LASER LITHOTRIPSY  2012       Home Medications    Prior to Admission medications   Medication Sig Start Date End Date Taking? Authorizing Provider  ALPRAZolam Duanne Moron) 0.25 MG tablet Take by mouth. 08/23/19   [provider]  cholecalciferol (VITAMIN D) 400 units TABS tablet Take 400 Units by mouth.    [provider]  gabapentin (NEURONTIN) 100 MG capsule Take 100 mg by mouth 5 (five) times daily.    [provider]  hydrochlorothiazide (HYDRODIURIL) 25 MG tablet Take 25 mg by mouth daily.    [provider]  levofloxacin (LEVAQUIN) 500 MG tablet Take 1 tablet (500 mg total) by mouth daily. 10/22/21   Hassell Done, Mary-Margaret, FNP  magnesium 30 MG tablet Take 30 mg by mouth 2 (two) times daily.    [provider]  meloxicam (MOBIC) 15 MG tablet Take 15 mg by mouth daily. 12/05/19   [provider]  montelukast (SINGULAIR) 10 MG tablet Take 10 mg by mouth daily. 12/12/19   [provider]  omeprazole (PRILOSEC) 40 MG capsule Take  40 mg by mouth daily.    [provider]  potassium phosphate, monobasic, (K-PHOS ORIGINAL) 500 MG tablet Take 500 mg by mouth 4 (four) times daily -  with meals and at bedtime.    [provider]  prochlorperazine (COMPAZINE) 10 MG tablet Take by mouth. 01/10/20   [provider]  sertraline (ZOLOFT) 50 MG tablet Take 50 mg by mouth daily.    [provider]  tamsulosin (FLOMAX) 0.4 MG CAPS capsule Take 0.4 mg by mouth daily. 12/09/19   [provider]   topiramate (TOPAMAX) 25 MG tablet Take 25 mg by mouth 2 (two) times daily. 02/13/20   [provider]  traMADol (ULTRAM) 50 MG tablet TAKE 1 2 TABS BY MOUTH EVERY 8HRS AS NEEDED FOR SEVERE PAIN 10/08/19   [provider]    Family History Family History  Problem Relation Age of Onset   Bladder Cancer Neg Hx    Prostate cancer Neg Hx    Kidney cancer Neg Hx     Social History Social History   Tobacco Use   Smoking status: Never   Smokeless tobacco: Never  Vaping Use   Vaping Use: Never used  Substance Use Topics   Alcohol use: No   Drug use: No     Allergies   Other   Review of Systems Review of Systems :negative unless otherwise stated in HPI.      Physical Exam Triage Vital Signs ED Triage Vitals  Enc Vitals Group     BP 07/07/22 1002 131/82     Pulse Rate 07/07/22 1002 (!) 53     Resp 07/07/22 1002 18     Temp 07/07/22 1002 97.7 F (36.5 C)     Temp Source 07/07/22 1002 Oral     SpO2 07/07/22 1002 98 %     Weight --      Height --      Head Circumference --      Peak Flow --      Pain Score 07/07/22 0959 8     Pain Loc --      Pain Edu? --      Excl. in [place]? --    No data found.  Updated Vital Signs BP 131/82 (BP Location: Right Arm)   Pulse (!) 53   Temp 97.7 F (36.5 C) (Oral)   Resp 18   SpO2 98%   Visual Acuity Right Eye Distance:   Left Eye Distance:   Bilateral Distance:    Right Eye Near:   Left Eye Near:    Bilateral Near:     Physical Exam  GEN: Uncomfortable appearing male CV: regular rate and rhythm RESP: no increased work of breathing, clear to ascultation bilaterally ABD: Bowel sounds present. Soft, left lower quadrant tenderness, non-distended.  No guarding, no rebound, no appreciable hepatosplenomegaly, no CVA tenderness, negative McBurney's MSK: no extremity edema, full flexion and extension of the lumbar spine, no paraspinal muscle tenderness SKIN: warm, dry NEURO: alert, moves all extremities  appropriately PSYCH: Normal affect, appropriate speech and behavior   UC Treatments / Results  Labs (all labs ordered are listed, but only abnormal results are displayed) Labs Reviewed  URINALYSIS, ROUTINE W REFLEX MICROSCOPIC - Abnormal; Notable for the following components:      Result Value   APPearance HAZY (*)    Hgb urine dipstick LARGE (*)    Protein, ur TRACE (*)    All other components within normal limits  URINALYSIS, MICROSCOPIC (  REFLEX) - Abnormal; Notable for the following components:   Bacteria, UA FEW (*)    All other components within normal limits  CBC WITH DIFFERENTIAL/PLATELET - Abnormal; Notable for the following components:   WBC 16.0 (*)    Neutro Abs 13.3 (*)    All other components within normal limits  BASIC METABOLIC PANEL - Abnormal; Notable for the following components:   Glucose, Bld 140 (*)    BUN 30 (*)    All other components within normal limits    EKG   Radiology CT Renal Stone Study  Result Date: 07/07/2022 CLINICAL DATA:  Hervey Ard left-sided abdominal pain, history of renal stones. EXAM: CT ABDOMEN AND PELVIS WITHOUT CONTRAST TECHNIQUE: Multidetector CT imaging of the abdomen and pelvis was performed following the standard protocol without IV contrast. RADIATION DOSE REDUCTION: This exam was performed according to the departmental dose-optimization program which includes automated exposure control, adjustment of the mA and/or kV according to patient size and/or use of iterative reconstruction technique. COMPARISON:  CT December 12, 2019 FINDINGS: Lower chest: No acute abnormality. Hepatobiliary: Hepatic steatosis. Gallbladder is unremarkable. No biliary ductal dilation. Pancreas: No pancreatic ductal dilation or evidence of acute inflammation. Spleen: No splenomegaly. Adrenals/Urinary Tract: Bilateral adrenal glands appear normal. Mild left-sided hydroureteronephrosis to a 4 mm stone in the distal ureter near the pelvic inlet. Additional punctate  nonobstructive left renal stones measure up to 2 mm. No right-sided hydronephrosis or nephrolithiasis. 9 mm partially exophytic right lower pole renal lesion measures Hounsfield units of 23 previously measured 6 mm on CT December 12, 2019. Urinary bladder is minimally distended limiting evaluation. Stomach/Bowel: Stomach is distended with ingested material and gas. No pathologic dilation of small or large bowel. The appendix and terminal ileum appear normal. Colonic diverticulosis without findings of acute diverticulitis. Vascular/Lymphatic: Normal caliber abdominal aorta. No pathologically enlarged abdominal or pelvic lymph nodes. Reproductive: Prostate is unremarkable. Other: No significant abdominopelvic free fluid. Musculoskeletal: No acute osseous abnormality. IMPRESSION: 1. Mild left-sided hydroureteronephrosis to a 4 mm stone in the distal ureter near the pelvic inlet. 2. Additional punctate nonobstructive left renal stones measure up to 2 mm. 3. Hepatic steatosis. 4. Colonic diverticulosis without findings of acute diverticulitis. 5. Increased size of the partially exophytic 9 mm right lower pole renal lesion measuring Hounsfield units greater than that expected for simple fluid, possibly reflecting a hemorrhagic/proteinaceous cyst however renal neoplasm is not excluded consider more definitive characterization by nonemergent renal mass protocol MRI with and without contrast. Preferably as an outpatient upon resolution of patient's current symptomatology when they are better able to follow commands including breath hold. Electronically Signed   By: Dahlia Bailiff M.D.   On: 07/07/2022 12:15    Procedures Procedures (including critical care time)  Medications Ordered in UC Medications  ketorolac (TORADOL) injection 30 mg (30 mg Intramuscular Given 07/07/22 1038)  promethazine (PHENERGAN) injection 25 mg (25 mg Intramuscular Given 07/07/22 1036)    Initial Impression / Assessment and Plan / UC Course   I have reviewed the triage vital signs and the nursing notes.  Pertinent labs & imaging results that were available during my care of the patient were reviewed by me and considered in my medical decision making (see chart for details).         Patient is a  46 y.o. male with history of recurrent kidney stones who presents after having insidious lower abdominal pain that radiates to his left lower back 1 to 2 days ago.  Patient is uncomfortable  appearing.  He is somewhat bradycardic here at 75 which is not new.  Vital signs otherwise are stable. [place] is afebrile.  Urinalysis with large amount of hematuria that is supported on microscopy.  There is no concern for acute cystitis at this time.   Given Promethazine IM and Toradol IM.     On chart review patient follows with the [facility] urologist.  He has an extensive history of kidney stones and he had a ureteroscopy in 2012 for bilateral ureteral stones by Dr. Jacqlyn Larsen.  CT renal from April 2021 that showed a 5 x 5 x 7 mm stone in the right ureteropelvic junction and a 1 mm nonobstructing stone in the interpolar left kidney. Obtain labs and CT Renal to assess for acute kidney stone.  Patient reports improvement in pain and nausea with medications given.  He has slight increase in his serum creatinine (0.9>1.1).  There is frank leukocytosis with WBC 16.  CT scan reveals mild left-sided hydroureteronephrosis with a 4 millimeter stone in the distal ureter near the pelvic inlet and also has 2 mm nonobstructive stone in the left kidney.  Additionally, there is a right 9 mm cyst in the lower pole of the kidney that may be hemorrhagic/proteinaceous and is and radiology recommended additional imaging for this.  Findings discussed with patient and recommended ED evaluation for obstructive stone.  Patient agrees with plan and is stable enough to travel via private vehicle to [facility]. Spoke with triage nurse at [facility] ED who will  expect patient.  Discussed MDM, treatment plan and plan for follow-up with patient/parent who agrees with plan.    Final Clinical Impressions(s) / UC Diagnoses   Final diagnoses:  Recurrent kidney stones  Hydronephrosis with urinary obstruction due to renal calculus  Leukocytosis, unspecified type     Discharge Instructions      Your CT was concerning for an obstructing kidney stone in the left urinary tract you also have a 62m stone in the right kidney.  There was a cyst on your right lower kidney that will need to be followed up by your urologist or PCP. You also have an elevated white blood cell count which is a marker for inflammation or infection.    You have been advised to follow up immediately in the emergency department for concerning signs or symptoms as discussed during your visit. If you declined EMS transport, please have a family member take you directly to the ED at this time. Do not delay.   Based on concerns about condition, if you do not follow up in the ED, you may risk poor outcomes including worsening of condition, delayed treatment and potentially life threatening issues. If you have declined to go to the ED at this time, you should call your PCP immediately to set up a follow up appointment.   Go to ED for red flag symptoms, including; fevers you cannot reduce with Tylenol/Motrin, severe headaches, vision changes, numbness/weakness in part of the body, lethargy, confusion, intractable vomiting, severe dehydration, chest pain, breathing difficulty, severe persistent abdominal or pelvic pain, signs of severe infection (increased redness, swelling of an area), feeling faint or passing out, dizziness, etc. You should especially go to the ED for sudden acute worsening of condition if you do not elect to go at this time.        ED Prescriptions   None    PDMP not reviewed this encounter.   BLyndee Hensen DO 07/07/22 1237    Satoshi Kalas,  Ronnette Juniper, DO 07/07/22  1239

## 2022-07-07 NOTE — ED Triage Notes (Signed)
Patient to ED for kidney stones (left side). Patient was seen at [facility] in [facility]- had blood work and CT there. Sent here due to obstructing kidney stone.

## 2022-07-07 NOTE — ED Provider Notes (Signed)
   [facility] Provider Note    Event Date/Time   First MD Initiated Contact with Patient 07/07/22 1333     (approximate)   History   Flank Pain   HPI  Patrick Cobb is a 46 y.o. male who was sent to the emergency department today from urgent care after being diagnosed with 4 mm distal ureteral stone.  The patient has been having pain in his left lower back.  He says that he has history of kidney stones and the pain remind him of his previous kidney stones.  The patient had blood work urinalysis and CT scan done in urgent care.  He was given Toradol at urgent care and did feel improvement.     Physical Exam   Triage Vital Signs: ED Triage Vitals [07/07/22 1301]  Enc Vitals Group     BP (!) 124/93     Pulse Rate 97     Resp 18     Temp 97.8 F (36.6 C)     Temp Source Oral     SpO2 97 %     Weight 280 lb (127 kg)     Height 6\' 3"  (1.905 m)     Head Circumference      Peak Flow      Pain Score 7     Pain Loc      Pain Edu?      Excl. in [place]?     Most recent vital signs: Vitals:   07/07/22 1301  BP: (!) 124/93  Pulse: 97  Resp: 18  Temp: 97.8 F (36.6 C)  SpO2: 97%   General: Awake, alert, oriented. CV:  Good peripheral perfusion. Regular rate and rhythm. Resp:  Normal effort. Lungs clear. Abd:  No distention.     ED Results / Procedures / Treatments   Labs (all labs ordered are listed, but only abnormal results are displayed) Labs done at urgent care today reviewed   EKG  None   RADIOLOGY CT scan done at urgent care today reviewed   PROCEDURES:  Critical Care performed: No  Procedures   MEDICATIONS ORDERED IN ED: Medications - No data to display   IMPRESSION / MDM / [place] / ED COURSE  I reviewed the triage vital signs and the nursing notes.                              Differential diagnosis includes, but is not limited to, kidney stone  Patient's presentation is most consistent with  acute presentation with potential threat to life or bodily function.  Patient presented to the emergency department today from urgent care after diagnosis of 4 mm distal left ureteral stone.  I reviewed CT imaging and lab work performed at urgent care and agree with that diagnosis.  Discussed this with the patient.  Will give patient prescription for oral Toradol and nausea medication.  Patient already has Flomax prescription.  Also discussed with patient portance of following up with either urologist or primary care to obtain further imaging on the cyst seen of his kidney.  FINAL CLINICAL IMPRESSION(S) / ED DIAGNOSES   Final diagnoses:  Kidney stone      Note:  This document was prepared using Dragon voice recognition software and may include unintentional dictation errors.    Nance Pear, MD 07/07/22 [phone]

## 2022-07-13 NOTE — Progress Notes (Unsigned)
07/14/2022 9:18 PM   Patrick Cobb 06/16/1976 [id number]  Referring provider: Adin Hector, MD [place] [facility] [place],  [place] [address]  Urological history: 1.     No chief complaint on file.   HPI: Patrick Cobb is a 46 y.o. male who presents today for follow up from ED.   He presented to the ED on 07/07/2022 from [facility] urgent care after being seen for left sided flank pain and findings for a left ureteral stone w/ hydronephrosis.  CT renal stone study mild left-sided hydroureteronephrosis to a 4 mm stone in the distal ureter near the pelvic inlet. Additional punctate nonobstructive left renal stones measure up to 2 mm.  Also a finding of Increased size of the partially exophytic 9 mm right lower pole renal lesion measuring Hounsfield units greater than that expected for simple fluid, possibly reflecting a hemorrhagic/proteinaceous cyst however renal neoplasm is not excluded    Serum creatinine 1.13, WBC count 16.0, UA yellow hazy, SG 1.020, pH 6.0, large blood, trace protein, > 50 RBC's, 0-5 WBC's, few bacteria, 0-5 squames and mucus present.    Meds given in the ED: Flomax 0.4 mg daily, Toradol and Zofran   Today, ***     PMH: Past Medical History:  Diagnosis Date   Arthritis    Chronic kidney disease    kidney stones   GERD (gastroesophageal reflux disease)    Nephrolithiasis     Surgical History: Past Surgical History:  Procedure Laterality Date   EXTRACORPOREAL SHOCK WAVE LITHOTRIPSY Right 12/19/2019   Procedure: EXTRACORPOREAL SHOCK WAVE LITHOTRIPSY (ESWL);  Surgeon: Hollice Espy, MD;  Location: [facility] ORS;  Service: Urology;  Laterality: Right;   MYRINGOTOMY WITH TUBE PLACEMENT     SHOULDER ARTHROSCOPY WITH DISTAL CLAVICLE RESECTION Right 10/31/2017   Procedure: SHOULDER ARTHROSCOPY WITH DISTAL CLAVICLE EXCISION, SUBACROMIAL DECOMPRESSION, EXTENSIVE DEBRIDEMENT;  Surgeon: Leim Fabry, MD;  Location[facility];  Service: Orthopedics;  Laterality: Right;  MAC W INTERSCALENE BLOCK BEACH CHAIR W/ SPYDER SIMPLE SLING   URETEROSCOPY WITH HOLMIUM LASER LITHOTRIPSY  2012    Home Medications:  Allergies as of 07/14/2022       Reactions   Other    Other reaction(s): Other (See Comments) Flu like symptoms to tetanus shot        Medication List        Accurate as of July 13, 2022  9:18 PM. If you have any questions, ask your nurse or doctor.          ALPRAZolam 0.25 MG tablet Commonly known as: XANAX Take by mouth.   cholecalciferol 10 MCG (400 UNIT) Tabs tablet Commonly known as: VITAMIN D3 Take 400 Units by mouth.   gabapentin 100 MG capsule Commonly known as: NEURONTIN Take 100 mg by mouth 5 (five) times daily.   hydrochlorothiazide 25 MG tablet Commonly known as: HYDRODIURIL Take 25 mg by mouth daily.   ketorolac 10 MG tablet Commonly known as: TORADOL Take 1 tablet (10 mg total) by mouth every 8 (eight) hours as needed for severe pain.   levofloxacin 500 MG tablet Commonly known as: Levaquin Take 1 tablet (500 mg total) by mouth daily.   magnesium 30 MG tablet Take 30 mg by mouth 2 (two) times daily.   meloxicam 15 MG tablet Commonly known as: MOBIC Take 15 mg by mouth daily.   montelukast 10 MG tablet Commonly known as: SINGULAIR Take 10 mg by mouth daily.  omeprazole 40 MG capsule Commonly known as: PRILOSEC Take 40 mg by mouth daily.   ondansetron 4 MG tablet Commonly known as: Zofran Take 1 tablet (4 mg total) by mouth every 8 (eight) hours as needed for nausea or vomiting.   potassium phosphate (monobasic) 500 MG tablet Commonly known as: K-PHOS ORIGINAL Take 500 mg by mouth 4 (four) times daily -  with meals and at bedtime.   prochlorperazine 10 MG tablet Commonly known as: COMPAZINE Take by mouth.   sertraline 50 MG tablet Commonly known as: ZOLOFT Take 50 mg by mouth daily.   tamsulosin 0.4 MG Caps capsule Commonly known as:  FLOMAX Take 0.4 mg by mouth daily.   topiramate 25 MG tablet Commonly known as: TOPAMAX Take 25 mg by mouth 2 (two) times daily.   traMADol 50 MG tablet Commonly known as: ULTRAM TAKE 1 2 TABS BY MOUTH EVERY 8HRS AS NEEDED FOR SEVERE PAIN        Allergies:  Allergies  Allergen Reactions   Other     Other reaction(s): Other (See Comments) Flu like symptoms to tetanus shot    Family History: Family History  Problem Relation Age of Onset   Bladder Cancer Neg Hx    Prostate cancer Neg Hx    Kidney cancer Neg Hx     Social History:  reports that he has never smoked. He has never used smokeless tobacco. He reports that he does not drink alcohol and does not use drugs.  ROS: Pertinent ROS in HPI  Physical Exam: There were no vitals taken for this visit.  Constitutional:  Well nourished. Alert and oriented, No acute distress. HEENT: [place] AT, moist mucus membranes.  Trachea midline, no masses. Cardiovascular: No clubbing, cyanosis, or edema. Respiratory: Normal respiratory effort, no increased work of breathing. GI: Abdomen is soft, non tender, non distended, no abdominal masses. Liver and spleen not palpable.  No hernias appreciated.  Stool sample for occult testing is not indicated.   GU: No CVA tenderness.  No bladder fullness or masses.  Patient with circumcised/uncircumcised phallus. ***Foreskin easily retracted***  Urethral meatus is patent.  No penile discharge. No penile lesions or rashes. Scrotum without lesions, cysts, rashes and/or edema.  Testicles are located scrotally bilaterally. No masses are appreciated in the testicles. Left and right epididymis are normal. Rectal: Patient with  normal sphincter tone. Anus and perineum without scarring or rashes. No rectal masses are appreciated. Prostate is approximately *** grams, *** nodules are appreciated. Seminal vesicles are normal. Skin: No rashes, bruises or suspicious lesions. Lymph: No cervical or inguinal  adenopathy. Neurologic: Grossly intact, no focal deficits, moving all 4 extremities. Psychiatric: Normal mood and affect.  Laboratory Data: Lab Results  Component Value Date   WBC 16.0 (H) 07/07/2022   HGB 14.4 07/07/2022   HCT 44.1 07/07/2022   MCV 88.0 07/07/2022   PLT 249 07/07/2022    Lab Results  Component Value Date   CREATININE 1.13 07/07/2022    No results found for: "PSA"  No results found for: "TESTOSTERONE"  No results found for: "HGBA1C"  No results found for: "TSH"  No results found for: "CHOL", "HDL", "CHOLHDL", "VLDL", "LDLCALC"  No results found for: "AST" No results found for: "ALT" No components found for: "ALKALINEPHOPHATASE" No components found for: "BILIRUBINTOTAL"  No results found for: "ESTRADIOL"  Urinalysis    Component Value Date/Time   COLORURINE YELLOW 07/07/2022 1003   APPEARANCEUR HAZY (A) 07/07/2022 1003   APPEARANCEUR Cloudy (A) 02/14/2020 9563  LABSPEC 1.020 07/07/2022 1003   PHURINE 6.0 07/07/2022 1003   GLUCOSEU NEGATIVE 07/07/2022 1003   HGBUR LARGE (A) 07/07/2022 1003   BILIRUBINUR NEGATIVE 07/07/2022 1003   BILIRUBINUR Negative 02/14/2020 [place] 07/07/2022 1003   PROTEINUR TRACE (A) 07/07/2022 1003   NITRITE NEGATIVE 07/07/2022 1003   LEUKOCYTESUR NEGATIVE 07/07/2022 1003    I have reviewed the labs.   Pertinent Imaging: Narrative & Impression  CLINICAL DATA:  Hervey Ard left-sided abdominal pain, history of renal stones.   EXAM: CT ABDOMEN AND PELVIS WITHOUT CONTRAST   TECHNIQUE: Multidetector CT imaging of the abdomen and pelvis was performed following the standard protocol without IV contrast.   RADIATION DOSE REDUCTION: This exam was performed according to the departmental dose-optimization program which includes automated exposure control, adjustment of the mA and/or kV according to patient size and/or use of iterative reconstruction technique.   COMPARISON:  CT December 12, 2019    FINDINGS: Lower chest: No acute abnormality.   Hepatobiliary: Hepatic steatosis. Gallbladder is unremarkable. No biliary ductal dilation.   Pancreas: No pancreatic ductal dilation or evidence of acute inflammation.   Spleen: No splenomegaly.   Adrenals/Urinary Tract: Bilateral adrenal glands appear normal. Mild left-sided hydroureteronephrosis to a 4 mm stone in the distal ureter near the pelvic inlet. Additional punctate nonobstructive left renal stones measure up to 2 mm. No right-sided hydronephrosis or nephrolithiasis. 9 mm partially exophytic right lower pole renal lesion measures Hounsfield units of 23 previously measured 6 mm on CT December 12, 2019. Urinary bladder is minimally distended limiting evaluation.   Stomach/Bowel: Stomach is distended with ingested material and gas. No pathologic dilation of small or large bowel. The appendix and terminal ileum appear normal. Colonic diverticulosis without findings of acute diverticulitis.   Vascular/Lymphatic: Normal caliber abdominal aorta. No pathologically enlarged abdominal or pelvic lymph nodes.   Reproductive: Prostate is unremarkable.   Other: No significant abdominopelvic free fluid.   Musculoskeletal: No acute osseous abnormality.   IMPRESSION: 1. Mild left-sided hydroureteronephrosis to a 4 mm stone in the distal ureter near the pelvic inlet. 2. Additional punctate nonobstructive left renal stones measure up to 2 mm. 3. Hepatic steatosis. 4. Colonic diverticulosis without findings of acute diverticulitis. 5. Increased size of the partially exophytic 9 mm right lower pole renal lesion measuring Hounsfield units greater than that expected for simple fluid, possibly reflecting a hemorrhagic/proteinaceous cyst however renal neoplasm is not excluded consider more definitive characterization by nonemergent renal mass protocol MRI with and without contrast. Preferably as an outpatient upon resolution of patient's  current symptomatology when they are better able to follow commands including breath hold.     Electronically Signed   By: Dahlia Bailiff M.D.   On: 07/07/2022 12:15  I have independently reviewed the films.    Assessment & Plan:  ***  1. Left ureteral stone -KUB ***  2. Microscopic hematuria -UA ***   3. Right renal lesion -9 mm lesion which has increased since last imaging HU greater than expected for simple fluid cyst -pursue MRI once stone has passed/removed  No follow-ups on file.  These notes generated with voice recognition software. I apologize for typographical errors.  [place], [place] [address]  [place], [place] [address] [phone]

## 2022-07-14 ENCOUNTER — Other Ambulatory Visit: Payer: Self-pay

## 2022-07-14 ENCOUNTER — Ambulatory Visit (INDEPENDENT_AMBULATORY_CARE_PROVIDER_SITE_OTHER): Payer: BC Managed Care – PPO | Admitting: Urology

## 2022-07-14 ENCOUNTER — Encounter: Payer: Self-pay | Admitting: Urology

## 2022-07-14 ENCOUNTER — Ambulatory Visit
Admission: RE | Admit: 2022-07-14 | Discharge: 2022-07-14 | Disposition: A | Discharge status: Home / Self Care | Payer: BC Managed Care – PPO | Source: Ambulatory Visit | Attending: Urology | Admitting: Urology

## 2022-07-14 ENCOUNTER — Telehealth: Payer: Self-pay | Admitting: Urology

## 2022-07-14 VITALS — BP 125/87 | HR 69 | Ht 75.0 in | Wt 280.0 lb

## 2022-07-14 DIAGNOSIS — N201 Calculus of ureter: Secondary | ICD-10-CM | POA: Insufficient documentation

## 2022-07-14 DIAGNOSIS — R3129 Other microscopic hematuria: Secondary | ICD-10-CM

## 2022-07-14 DIAGNOSIS — N2889 Other specified disorders of kidney and ureter: Secondary | ICD-10-CM

## 2022-07-14 LAB — MICROSCOPIC EXAMINATION

## 2022-07-14 LAB — URINALYSIS, COMPLETE
Bilirubin, UA: NEGATIVE
Glucose, UA: NEGATIVE
Ketones, UA: NEGATIVE
Nitrite, UA: NEGATIVE
Protein,UA: NEGATIVE
Specific Gravity, UA: 1.02 (ref 1.005–1.030)
Urobilinogen, Ur: 0.2 mg/dL (ref 0.2–1.0)
pH, UA: 5.5 (ref 5.0–7.5)

## 2022-07-14 MED ORDER — HYDROCODONE-ACETAMINOPHEN 5-325 MG PO TABS: 1.0000 | ORAL_TABLET | Freq: Four times a day (QID) | ORAL | 0 refills | Status: DC | PRN

## 2022-07-14 NOTE — Telephone Encounter (Signed)
Please let Patrick Cobb know that the stone is right at the bladder and ready to pass.   We should see  him again in one week with KUB and UA.

## 2022-07-14 NOTE — Telephone Encounter (Signed)
Ok per [employer], pts wife notified. 1 week appt booked, pt confirmed and given instructions on getting KUB prior to appt.

## 2022-07-17 LAB — CULTURE, URINE COMPREHENSIVE

## 2022-07-21 ENCOUNTER — Ambulatory Visit
Admission: RE | Admit: 2022-07-21 | Discharge: 2022-07-21 | Disposition: A | Discharge status: Home / Self Care | Payer: BC Managed Care – PPO | Attending: Urology | Admitting: Urology

## 2022-07-21 ENCOUNTER — Ambulatory Visit (INDEPENDENT_AMBULATORY_CARE_PROVIDER_SITE_OTHER): Payer: BC Managed Care – PPO | Admitting: Urology

## 2022-07-21 ENCOUNTER — Ambulatory Visit
Admission: RE | Admit: 2022-07-21 | Discharge: 2022-07-21 | Disposition: A | Discharge status: Home / Self Care | Payer: BC Managed Care – PPO | Source: Ambulatory Visit | Attending: Urology | Admitting: Urology

## 2022-07-21 VITALS — BP 128/83 | HR 80 | Ht 74.0 in | Wt 285.0 lb

## 2022-07-21 DIAGNOSIS — Z87442 Personal history of urinary calculi: Secondary | ICD-10-CM | POA: Diagnosis not present

## 2022-07-21 DIAGNOSIS — N2889 Other specified disorders of kidney and ureter: Secondary | ICD-10-CM | POA: Diagnosis not present

## 2022-07-21 DIAGNOSIS — N201 Calculus of ureter: Secondary | ICD-10-CM

## 2022-07-21 LAB — URINALYSIS, COMPLETE
Bilirubin, UA: NEGATIVE
Glucose, UA: NEGATIVE
Ketones, UA: NEGATIVE
Leukocytes,UA: NEGATIVE
Nitrite, UA: NEGATIVE
Protein,UA: NEGATIVE
RBC, UA: NEGATIVE
Specific Gravity, UA: 1.02 (ref 1.005–1.030)
Urobilinogen, Ur: 0.2 mg/dL (ref 0.2–1.0)
pH, UA: 5.5 (ref 5.0–7.5)

## 2022-07-21 LAB — MICROSCOPIC EXAMINATION

## 2022-07-21 NOTE — Progress Notes (Unsigned)
07/21/2022 1:14 PM   Patrick Cobb October 29, 1975 [id number]  Referring provider: Adin Hector, MD [facility] [place],  [place] [address]  Urological history: 1. Nephrolithiasis -stone composition calcium oxalate dihydrate -History of spontaneously passing stones (wife states greater than 30) -Bilateral ureteroscopy with Dr. Jacqlyn Larsen in 2012 -Right ESWL 2021   Chief Complaint  Patient presents with   Nephrolithiasis    HPI: Patrick Cobb is a 46 y.o. male who presents today for follow up from ED w/ his wife, Patrick Cobb.  He presented to the ED on 07/07/2022 from [facility] urgent care after being seen for left sided flank pain and findings for a left ureteral stone w/ hydronephrosis.  CT renal stone study mild left-sided hydroureteronephrosis to a 4 mm stone in the distal ureter near the pelvic inlet. Additional punctate nonobstructive left renal stones measure up to 2 mm.  Also a finding of Increased size of the partially exophytic 9 mm right lower pole renal lesion measuring Hounsfield units greater than that expected for simple fluid, possibly reflecting a hemorrhagic/proteinaceous cyst however renal neoplasm is not excluded.  Serum creatinine 1.13, WBC count 16.0, UA yellow hazy, SG 1.020, pH 6.0, large blood, trace protein, > 50 RBC's, 0-5 WBC's, few bacteria, 0-5 squames and mucus present.   Meds given in the ED: Flomax 0.4 mg daily, Toradol and Zofran  At his visit on 11/02/23023, he was having left groin pain.  UA yellow clear, SG 1.020, 1+ blood, pH 5.5, 1+ leuk, 0-5 WBC's, 11-30 RBC's and few bacteria.  KUB (07/14/2022) - 4 mm stone on on the left has now migrated into the left UVJ.  Today, he has the stone fragment in hand and the pain has abated.  Patient denies any modifying or aggravating factors.  Patient denies any gross hematuria, dysuria or suprapubic/flank pain.  Patient denies any fevers, chills, nausea or vomiting.    KUB (07/21/2022)  left UVJ stone is no longer seen.    They are ready to proceed with the MRI of the kidneys for further evaluation of right lower pole lesion.   PMH: Past Medical History:  Diagnosis Date   Arthritis    Chronic kidney disease    kidney stones   GERD (gastroesophageal reflux disease)    Nephrolithiasis     Surgical History: Past Surgical History:  Procedure Laterality Date   EXTRACORPOREAL SHOCK WAVE LITHOTRIPSY Right 12/19/2019   Procedure: EXTRACORPOREAL SHOCK WAVE LITHOTRIPSY (ESWL);  Surgeon: Hollice Espy, MD;  Location: [facility];  Service: Urology;  Laterality: Right;   MYRINGOTOMY WITH TUBE PLACEMENT     SHOULDER ARTHROSCOPY WITH DISTAL CLAVICLE RESECTION Right 10/31/2017   Procedure: SHOULDER ARTHROSCOPY WITH DISTAL CLAVICLE EXCISION, SUBACROMIAL DECOMPRESSION, EXTENSIVE DEBRIDEMENT;  Surgeon: Leim Fabry, MD;  Location: [facility];  Service: Orthopedics;  Laterality: Right;  MAC W INTERSCALENE BLOCK BEACH CHAIR W/ SPYDER SIMPLE SLING   URETEROSCOPY WITH HOLMIUM LASER LITHOTRIPSY  2012    Home Medications:  Allergies as of 07/21/2022       Reactions   Other    Other reaction(s): Other (See Comments) Flu like symptoms to tetanus shot        Medication List        Accurate as of July 21, 2022 11:59 PM. If you have any questions, ask your nurse or doctor.          Ajovy 225 MG/1.5ML Soaj Generic drug: Fremanezumab-vfrm Inject into the skin.   ALPRAZolam  0.25 MG tablet Commonly known as: XANAX Take by mouth.   cholecalciferol 10 MCG (400 UNIT) Tabs tablet Commonly known as: VITAMIN D3 Take 400 Units by mouth.   Dupixent 300 MG/2ML Sopn Generic drug: Dupilumab Inject into the skin.   gabapentin 100 MG capsule Commonly known as: NEURONTIN Take 100 mg by mouth 5 (five) times daily.   hydrochlorothiazide 25 MG tablet Commonly known as: HYDRODIURIL Take 25 mg by mouth daily.   HYDROcodone-acetaminophen 5-325 MG tablet Commonly known as:  Norco Take 1 tablet by mouth every 6 (six) hours as needed for moderate pain.   ketorolac 10 MG tablet Commonly known as: TORADOL Take 1 tablet (10 mg total) by mouth every 8 (eight) hours as needed for severe pain.   magnesium 30 MG tablet Take 30 mg by mouth 2 (two) times daily.   meloxicam 15 MG tablet Commonly known as: MOBIC Take 15 mg by mouth daily.   montelukast 10 MG tablet Commonly known as: SINGULAIR Take 10 mg by mouth daily.   Multi-Vitamin tablet Take 1 tablet by mouth daily.   omeprazole 40 MG capsule Commonly known as: PRILOSEC Take 40 mg by mouth daily.   ondansetron 4 MG tablet Commonly known as: Zofran Take 1 tablet (4 mg total) by mouth every 8 (eight) hours as needed for nausea or vomiting.   potassium phosphate (monobasic) 500 MG tablet Commonly known as: K-PHOS ORIGINAL Take 500 mg by mouth 4 (four) times daily -  with meals and at bedtime.   prochlorperazine 10 MG tablet Commonly known as: COMPAZINE Take by mouth.   sertraline 50 MG tablet Commonly known as: ZOLOFT Take 50 mg by mouth daily.   SUMAtriptan 100 MG tablet Commonly known as: IMITREX Take by mouth.   tamsulosin 0.4 MG Caps capsule Commonly known as: FLOMAX Take 0.4 mg by mouth daily.   topiramate 25 MG tablet Commonly known as: TOPAMAX Take 25 mg by mouth 2 (two) times daily.   traMADol 50 MG tablet Commonly known as: ULTRAM TAKE 1 2 TABS BY MOUTH EVERY 8HRS AS NEEDED FOR SEVERE PAIN        Allergies:  Allergies  Allergen Reactions   Other     Other reaction(s): Other (See Comments) Flu like symptoms to tetanus shot    Family History: Family History  Problem Relation Age of Onset   Bladder Cancer Neg Hx    Prostate cancer Neg Hx    Kidney cancer Neg Hx     Social History:  reports that he has never smoked. He has never used smokeless tobacco. He reports that he does not drink alcohol and does not use drugs.  ROS: Pertinent ROS in HPI  Physical  Exam: BP 128/83   Pulse 80   Ht _0  (1.88 m)   Wt 285 lb (129.3 kg)   BMI 36.59 kg/m   Constitutional:  Well nourished. Alert and oriented, No acute distress. HEENT: [place] AT, moist mucus membranes.  Trachea midline Cardiovascular: No clubbing, cyanosis, or edema. Respiratory: Normal respiratory effort, no increased work of breathing. Neurologic: Grossly intact, no focal deficits, moving all 4 extremities. Psychiatric: Normal mood and affect.   Laboratory Data: Results for orders placed or performed in visit on 07/21/22  Microscopic Examination   Urine  Result Value Ref Range   WBC, UA 0-5 0 - 5 /hpf   RBC, Urine 0-2 0 - 2 /hpf   Epithelial Cells (non renal) 0-10 0 - 10 /hpf   Mucus, UA  Present (A) Not Estab.   Bacteria, UA Few None seen/Few  Urinalysis, Complete  Result Value Ref Range   Specific Gravity, UA 1.020 1.005 - 1.030   pH, UA 5.5 5.0 - 7.5   Color, UA Yellow Yellow   Appearance Ur Clear Clear   Leukocytes,UA Negative Negative   Protein,UA Negative Negative/Trace   Glucose, UA Negative Negative   Ketones, UA Negative Negative   RBC, UA Negative Negative   Bilirubin, UA Negative Negative   Urobilinogen, Ur 0.2 0.2 - 1.0 mg/dL   Nitrite, UA Negative Negative   Microscopic Examination See below:      I have reviewed the labs.   Pertinent Imaging: CLINICAL DATA:  Left-sided renal calculi   EXAM: ABDOMEN - 1 VIEW   COMPARISON:  07/14/2022, 07/07/2022   FINDINGS: Supine frontal views of the abdomen and pelvis are obtained, excluding the hemidiaphragms by collimation. Interval passage of the left ureteral calculus identified on the previous exam. There are no radiopaque calculi. No abdominal masses. Bowel gas pattern is unremarkable without obstruction or ileus. No acute bony abnormalities.   IMPRESSION: 1. No radiopaque urinary tract calculi.     Electronically Signed   By: Randa Ngo M.D.   On: 07/24/2022 22:18 I have independently reviewed  the films.  See HPI.      Assessment & Plan:    1. Left ureteral stone -KUB (07/21/2022) - stone no longer visualized -stone sent for analysis  2. Microscopic hematuria -resolved  3. Right renal lesion -9 mm lesion which has increased since last imaging HU greater than expected for simple fluid cyst -pursue MRI   Return for I will call patient with results.  These notes generated with voice recognition software. I apologize for typographical errors.  [place], [place] [address]  [place], [place] [address] [phone]

## 2022-07-24 ENCOUNTER — Encounter: Payer: Self-pay | Admitting: Urology

## 2022-07-30 LAB — CALCULI, WITH PHOTOGRAPH (CLINICAL LAB)
Calcium Oxalate Dihydrate: 60 %
Hydroxyapatite: 40 %
Weight Calculi: 59 mg

## 2022-08-02 ENCOUNTER — Ambulatory Visit
Admission: RE | Admit: 2022-08-02 | Discharge: 2022-08-02 | Disposition: A | Discharge status: Home / Self Care | Payer: BC Managed Care – PPO | Source: Ambulatory Visit | Attending: Urology | Admitting: Urology

## 2022-08-02 DIAGNOSIS — N2889 Other specified disorders of kidney and ureter: Secondary | ICD-10-CM | POA: Insufficient documentation

## 2022-08-02 MED ORDER — GADOBUTROL 1 MMOL/ML IV SOLN
7.0000 mL | Freq: Once | INTRAVENOUS | Status: AC | PRN
  Administered 2022-08-02: 7 mL via INTRAVENOUS

## 2022-08-03 ENCOUNTER — Other Ambulatory Visit: Payer: Self-pay

## 2022-08-03 DIAGNOSIS — N201 Calculus of ureter: Secondary | ICD-10-CM

## 2023-02-11 DIAGNOSIS — K603 Anal fistula, unspecified: Secondary | ICD-10-CM

## 2023-02-11 HISTORY — DX: Anal fistula, unspecified: K60.30

## 2023-02-11 HISTORY — DX: Anal fistula: K60.3

## 2023-02-14 ENCOUNTER — Ambulatory Visit: Payer: Self-pay | Admitting: Surgery

## 2023-02-14 NOTE — H&P (View-Only) (Signed)
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 male who was referred by Bert Jack Klein III, MD for evaluation of above. First noted several years ago.  Last exacerbation 5mo ago.  Drainage, and lump.  Today, only complains of persistent bloody drainage.   Past Medical History:  has a past medical history of Anxiety, Asthma, unspecified asthma severity, unspecified whether complicated, unspecified whether persistent (HHS-HCC), Barrett's esophagus, DDD (degenerative disc disease), lumbar (06/11/2014), Exotropia, GERD (gastroesophageal reflux disease), History of hemorrhoids (05/04/2018), History of pneumonia (January 2023), HLD (hyperlipidemia), Polyneuropathy associated with underlying disease (CMS/HHS-HCC) (06/11/2014), Recurrent vertigo, Renal stones, and Unspecified vitamin D deficiency.  Past Surgical History:  has a past surgical history that includes Tonsillectomy; Status post tympanostomy tubes; egd (02/20/2002); egd (07/31/2014); shoulder arthoscopy (Right, 10/2017); egd (06/19/2018); Colonoscopy (06/19/2018); and egd (10/20/2021).  Family History: family history includes Alzheimer's disease in an other family member; Autoimmune disease in his mother; COPD in his father; Colon cancer in his paternal grandfather and another family member; Colon polyps in his father; Crohn's disease in his maternal uncle; Diabetes in an other family member; Parkinsonism in an other family member; Sick sinus syndrome in his father; Thyroid disease in his mother; Ulcers in his father and maternal grandfather.  Social History:  reports that he has never smoked. He has never used smokeless tobacco. He reports that he does not currently use alcohol. He reports that he does not use drugs.  Current Medications: has a current medication list which includes the following prescription(s): albuterol mdi (proventil, ventolin, proair) hfa, cholecalciferol, dupilumab, trelegy ellipta, gabapentin,  magnesium, meloxicam, multivitamin, omeprazole, potassium, prochlorperazine, sertraline, sumatriptan, tamsulosin, topiramate, topiramate, tramadol, albuterol, budesonide, dupilumab, ajovy autoinjector, and metoprolol tartrate.  Allergies:  Allergies  Allergen Reactions   Tetanus And Diphtheria Toxoids Other (See Comments)    Flu like symptoms    ROS:  A 15 point review of systems was performed and pertinent positives and negatives noted in HPI   Objective:     BP (!) 134/93   Pulse 68   Ht 189.2 cm (6' 2.49")   Wt (!) 127.6 kg (281 lb 4.9 oz)   BMI 35.65 kg/m   Constitutional :  No distress, cooperative, alert  Lymphatics/Throat:  Supple with no lymphadenopathy  Respiratory:  Clear to auscultation bilaterally  Cardiovascular:  Regular rate and rhythm  Gastrointestinal: Soft, non-tender, non-distended, no organomegaly.  Musculoskeletal: Steady gait and movement  Skin: Cool and moist  Psychiatric: Normal affect, non-agitated, not confused  Rectal: Chaperone present for exam.  External exam noted to have two adajcent areas of ulceration, seems to be healing fistula openings on right lateral aspect, no discharge, erythema, TTP. DRE revealed, normal rectal tone, with palpable internal hemorrhoid noted at anterior portion with minimal discomfort.  After obtaining verbal consent, an anoscope was inserted after prepped with lubricant and internal hemorrhoids noted on DRE confirmed visually, with no evidence of thrombosis. No other pathology such as fissures, fistulas, polyps noted.  Scope withdrawn and patient tolerate procedure well.       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  Plan:     1. 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  Benefits include possible symptom relief, pathologic evaluation. Discussed the risk of surgery including recurrence, chronic  pain, post-op infxn, poor cosmesis, poor/delayed wound healing, and possible re-operation to address said   risks. The risks of general anesthetic, if used, includes MI, CVA, sudden death or even reaction to anesthetic medications also discussed.  Typical post-op recovery time of 3-5 days with possible activity restrictions were also discussed.  The patient verbalized understanding and all questions were answered to the patient's satisfaction.  2. Patient has elected to proceed with surgical treatment. Procedure will be scheduled.  labs/images/medications/previous chart entries reviewed personally and relevant changes/updates noted above.  

## 2023-02-14 NOTE — H&P (Signed)
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 male who was referred by Sallee Provencal, MD for evaluation of above. First noted several years ago.  Last exacerbation 29mo ago.  Drainage, and lump.  Today, only complains of persistent bloody drainage.   Past Medical History:  has a past medical history of Anxiety, Asthma, unspecified asthma severity, unspecified whether complicated, unspecified whether persistent (HHS-HCC), Barrett's esophagus, DDD (degenerative disc disease), lumbar (06/11/2014), Exotropia, GERD (gastroesophageal reflux disease), History of hemorrhoids (05/04/2018), History of pneumonia (January 2023), HLD (hyperlipidemia), Polyneuropathy associated with underlying disease (CMS/HHS-HCC) (06/11/2014), Recurrent vertigo, Renal stones, and Unspecified vitamin D deficiency.  Past Surgical History:  has a past surgical history that includes Tonsillectomy; Status post tympanostomy tubes; egd (02/20/2002); egd (07/31/2014); shoulder arthoscopy (Right, 10/2017); egd (06/19/2018); Colonoscopy (06/19/2018); and egd (10/20/2021).  Family History: family history includes Alzheimer's disease in an other family member; Autoimmune disease in his mother; COPD in his father; Colon cancer in his paternal grandfather and another family member; Colon polyps in his father; Crohn's disease in his maternal uncle; Diabetes in an other family member; Parkinsonism in an other family member; Sick sinus syndrome in his father; Thyroid disease in his mother; Ulcers in his father and maternal grandfather.  Social History:  reports that he has never smoked. He has never used smokeless tobacco. He reports that he does not currently use alcohol. He reports that he does not use drugs.  Current Medications: has a current medication list which includes the following prescription(s): albuterol mdi (proventil, ventolin, proair) hfa, cholecalciferol, dupilumab, trelegy ellipta, gabapentin,  magnesium, meloxicam, multivitamin, omeprazole, potassium, prochlorperazine, sertraline, sumatriptan, tamsulosin, topiramate, topiramate, tramadol, albuterol, budesonide, dupilumab, ajovy autoinjector, and metoprolol tartrate.  Allergies:  Allergies  Allergen Reactions   Tetanus And Diphtheria Toxoids Other (See Comments)    Flu like symptoms    ROS:  A 15 point review of systems was performed and pertinent positives and negatives noted in HPI   Objective:     BP (!) 134/93   Pulse 68   Ht 189.2 cm (6' 2.49")   Wt (!) 127.6 kg (281 lb 4.9 oz)   BMI 35.65 kg/m   Constitutional :  No distress, cooperative, alert  Lymphatics/Throat:  Supple with no lymphadenopathy  Respiratory:  Clear to auscultation bilaterally  Cardiovascular:  Regular rate and rhythm  Gastrointestinal: Soft, non-tender, non-distended, no organomegaly.  Musculoskeletal: Steady gait and movement  Skin: Cool and moist  Psychiatric: Normal affect, non-agitated, not confused  Rectal: Chaperone present for exam.  External exam noted to have two adajcent areas of ulceration, seems to be healing fistula openings on right lateral aspect, no discharge, erythema, TTP. DRE revealed, normal rectal tone, with palpable internal hemorrhoid noted at anterior portion with minimal discomfort.  After obtaining verbal consent, an anoscope was inserted after prepped with lubricant and internal hemorrhoids noted on DRE confirmed visually, with no evidence of thrombosis. No other pathology such as fissures, fistulas, polyps noted.  Scope withdrawn and patient tolerate procedure well.       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  Plan:     1. 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  Benefits include possible symptom relief, pathologic evaluation. Discussed the risk of surgery including recurrence, chronic  pain, post-op infxn, poor cosmesis, poor/delayed wound healing, and possible re-operation to address said  risks. The risks of general anesthetic, if used, includes MI, CVA, sudden death or even reaction to anesthetic medications also discussed.  Typical post-op recovery time of 3-5 days with possible activity restrictions were also discussed.  The patient verbalized understanding and all questions were answered to the patient's satisfaction.  2. Patient has elected to proceed with surgical treatment. Procedure will be scheduled.  labs/images/medications/previous chart entries reviewed personally and relevant changes/updates noted above.

## 2023-02-22 ENCOUNTER — Encounter
Admission: RE | Admit: 2023-02-22 | Discharge: 2023-02-22 | Disposition: A | Discharge status: Home / Self Care | Payer: BC Managed Care – PPO | Source: Ambulatory Visit | Attending: Surgery | Admitting: Surgery

## 2023-02-22 HISTORY — DX: Pneumonia, unspecified organism: J18.9

## 2023-02-22 HISTORY — DX: Migraine, unspecified, not intractable, without status migrainosus: G43.909

## 2023-02-22 HISTORY — DX: Prediabetes: R73.03

## 2023-02-22 HISTORY — DX: Personal history of urinary calculi: Z87.442

## 2023-02-22 HISTORY — DX: Anxiety disorder, unspecified: F41.9

## 2023-02-22 HISTORY — DX: Other intervertebral disc degeneration, lumbar region: M51.36

## 2023-02-22 HISTORY — DX: Vitamin D deficiency, unspecified: E55.9

## 2023-02-22 HISTORY — DX: Hyperlipidemia, unspecified: E78.5

## 2023-02-22 HISTORY — DX: Unspecified hemorrhoids: K64.9

## 2023-02-22 HISTORY — DX: Barrett's esophagus without dysplasia: K22.70

## 2023-02-22 HISTORY — DX: Severe persistent asthma, uncomplicated: J45.50

## 2023-02-22 HISTORY — DX: Other intervertebral disc degeneration, lumbar region without mention of lumbar back pain or lower extremity pain: M51.369

## 2023-02-22 NOTE — Patient Instructions (Addendum)
Your procedure is scheduled on: Monday, June 17 Report to the Registration Desk on the 1st floor of the [facility] To find out your arrival time, please call [phone] between 1PM - 3PM on: Friday, June 14 If your arrival time is 6:00 am, do not arrive before that time as the [facility] entrance doors do not open until 6:00 am.  REMEMBER: Instructions that are not followed completely may result in serious medical risk, up to and including death; or upon the discretion of your surgeon and anesthesiologist your surgery may need to be rescheduled.  Do not eat food after midnight the night before surgery.  No gum chewing or hard candies.  You may however, drink CLEAR liquids up to 2 hours before you are scheduled to arrive for your surgery. Do not drink anything within 2 hours of your scheduled arrival time.  Clear liquids include: - water  - apple juice without pulp - gatorade (not RED colors) - black coffee or tea (Do NOT add milk or creamers to the coffee or tea) Do NOT drink anything that is not on this list.  One week prior to surgery: starting today, June 12 Stop meloxicam and Anti-inflammatories (NSAIDS) such as Advil, Aleve, Ibuprofen, Motrin, Naproxen, Naprosyn and Aspirin based products such as Excedrin, Goody's Powder, BC Powder. Stop ANY OVER THE COUNTER supplements until after surgery. Stop vitamin C, vitamin D, magnesium, multiple vitamins, zinc. You may however, continue to take Tylenol if needed for pain up until the day of surgery.  Continue taking all prescribed medications  TAKE ONLY THESE MEDICATIONS THE MORNING OF SURGERY WITH A SIP OF WATER:  Albuterol nebulizer Trelegy inhaler Gabapentin Omeprazole (Prilosec) - (take one the night before and one on the morning of surgery - helps to prevent nausea after surgery.) Sertraline (Zoloft) Topiramate (Topamax)  Use inhalers on the day of surgery and bring your albuterol inhaler to the hospital.  No Alcohol  for 24 hours before or after surgery.  No Smoking including e-cigarettes for 24 hours before surgery.  No chewable tobacco products for at least 6 hours before surgery.  No nicotine patches on the day of surgery.  Do not use any "recreational" drugs for at least a week (preferably 2 weeks) before your surgery.  Please be advised that the combination of cocaine and anesthesia may have negative outcomes, up to and including death. If you test positive for cocaine, your surgery will be cancelled.  On the morning of surgery brush your teeth with toothpaste and water, you may rinse your mouth with mouthwash if you wish. Do not swallow any toothpaste or mouthwash.  Do not wear jewelry, make-up, hairpins, clips or nail polish.  Do not wear lotions, powders, or perfumes.   Do not shave body hair from the neck down 48 hours before surgery.  Contact lenses, hearing aids and dentures may not be worn into surgery.  Do not bring valuables to the hospital. [facility] is not responsible for any missing/lost belongings or valuables.   Notify your doctor if there is any change in your medical condition (cold, fever, infection).  Wear comfortable clothing (specific to your surgery type) to the hospital.  After surgery, you can help prevent lung complications by doing breathing exercises.  Take deep breaths and cough every 1-2 hours. Your doctor may order a device called an Incentive Spirometer to help you take deep breaths.  If you are being discharged the day of surgery, you will not be allowed to drive  home. You will need a responsible individual to drive you home and stay with you for 24 hours after surgery.   If you are taking public transportation, you will need to have a responsible individual with you.  Please call the Pre-admissions Testing Dept. at [phone] if you have any questions about these instructions.  Surgery Visitation Policy:  Patients having surgery or a procedure may  have two visitors.  Children under the age of 15 must have an adult with them who is not the patient.

## 2023-02-26 MED ORDER — ORAL CARE MOUTH RINSE: 15.0000 mL | Freq: Once | OROMUCOSAL | Status: AC

## 2023-02-26 MED ORDER — LACTATED RINGERS IV SOLN: INTRAVENOUS | Status: DC

## 2023-02-26 MED ORDER — CHLORHEXIDINE GLUCONATE 0.12 % MT SOLN
15.0000 mL | Freq: Once | OROMUCOSAL | Status: AC
  Administered 2023-02-27: 15 mL via OROMUCOSAL

## 2023-02-26 MED ORDER — CHLORHEXIDINE GLUCONATE CLOTH 2 % EX PADS: 6.0000 | MEDICATED_PAD | Freq: Once | CUTANEOUS | Status: DC

## 2023-02-27 ENCOUNTER — Encounter: Payer: Self-pay | Admitting: Surgery

## 2023-02-27 ENCOUNTER — Other Ambulatory Visit: Payer: Self-pay

## 2023-02-27 ENCOUNTER — Ambulatory Visit
Admission: RE | Admit: 2023-02-27 | Discharge: 2023-02-27 | Disposition: A | Discharge status: Home / Self Care | Payer: BC Managed Care – PPO | Attending: Surgery | Admitting: Surgery

## 2023-02-27 ENCOUNTER — Ambulatory Visit: Payer: BC Managed Care – PPO | Admitting: Anesthesiology

## 2023-02-27 ENCOUNTER — Encounter
Admission: RE | Disposition: A | Discharge status: Home / Self Care | Payer: Self-pay | Source: Home / Self Care | Attending: Surgery

## 2023-02-27 DIAGNOSIS — K629 Disease of anus and rectum, unspecified: Secondary | ICD-10-CM

## 2023-02-27 DIAGNOSIS — K603 Anal fistula: Secondary | ICD-10-CM | POA: Insufficient documentation

## 2023-02-27 SURGERY — EXAM UNDER ANESTHESIA
Anesthesia: General | Site: Anus

## 2023-02-27 MED ORDER — ONDANSETRON HCL 4 MG/2ML IJ SOLN: 4.0000 mg | Freq: Once | INTRAMUSCULAR | Status: DC | PRN

## 2023-02-27 MED ORDER — FENTANYL CITRATE (PF) 100 MCG/2ML IJ SOLN
INTRAMUSCULAR | Status: DC | PRN
  Administered 2023-02-27: 50 ug via INTRAVENOUS
  Administered 2023-02-27 (×2): 25 ug via INTRAVENOUS

## 2023-02-27 MED ORDER — CHLORHEXIDINE GLUCONATE 0.12 % MT SOLN
OROMUCOSAL | Status: AC
  Filled 2023-02-27: qty 15

## 2023-02-27 MED ORDER — BUPIVACAINE HCL (PF) 0.5 % IJ SOLN
INTRAMUSCULAR | Status: AC
  Filled 2023-02-27: qty 30

## 2023-02-27 MED ORDER — PROPOFOL 10 MG/ML IV BOLUS
INTRAVENOUS | Status: DC | PRN
  Administered 2023-02-27: 40 mg via INTRAVENOUS

## 2023-02-27 MED ORDER — MIDAZOLAM HCL 2 MG/2ML IJ SOLN
INTRAMUSCULAR | Status: AC
  Filled 2023-02-27: qty 2

## 2023-02-27 MED ORDER — DOCUSATE SODIUM 100 MG PO CAPS: 100.0000 mg | ORAL_CAPSULE | Freq: Two times a day (BID) | ORAL | 0 refills | Status: AC | PRN

## 2023-02-27 MED ORDER — OXYCODONE HCL 5 MG PO TABS
5.0000 mg | ORAL_TABLET | Freq: Once | ORAL | Status: AC | PRN
  Administered 2023-02-27: 5 mg via ORAL

## 2023-02-27 MED ORDER — BACITRACIN ZINC 500 UNIT/GM EX OINT
TOPICAL_OINTMENT | CUTANEOUS | Status: AC
  Filled 2023-02-27: qty 28.35

## 2023-02-27 MED ORDER — BUPIVACAINE-EPINEPHRINE (PF) 0.5% -1:200000 IJ SOLN
INTRAMUSCULAR | Status: DC | PRN
  Administered 2023-02-27: 2 mL

## 2023-02-27 MED ORDER — DEXMEDETOMIDINE HCL IN NACL 80 MCG/20ML IV SOLN
INTRAVENOUS | Status: DC | PRN
  Administered 2023-02-27: 4 ug via INTRAVENOUS
  Administered 2023-02-27: 8 ug via INTRAVENOUS

## 2023-02-27 MED ORDER — FENTANYL CITRATE (PF) 100 MCG/2ML IJ SOLN: 25.0000 ug | INTRAMUSCULAR | Status: DC | PRN

## 2023-02-27 MED ORDER — MIDAZOLAM HCL 2 MG/2ML IJ SOLN
INTRAMUSCULAR | Status: DC | PRN
  Administered 2023-02-27: 2 mg via INTRAVENOUS

## 2023-02-27 MED ORDER — ACETAMINOPHEN 10 MG/ML IV SOLN: 1000.0000 mg | Freq: Once | INTRAVENOUS | Status: DC | PRN

## 2023-02-27 MED ORDER — BACITRACIN ZINC 500 UNIT/GM EX OINT
TOPICAL_OINTMENT | CUTANEOUS | Status: DC | PRN
  Administered 2023-02-27: 1 via TOPICAL

## 2023-02-27 MED ORDER — OXYCODONE HCL 5 MG/5ML PO SOLN: 5.0000 mg | Freq: Once | ORAL | Status: AC | PRN

## 2023-02-27 MED ORDER — PROPOFOL 500 MG/50ML IV EMUL
INTRAVENOUS | Status: DC | PRN
  Administered 2023-02-27: 125 ug/kg/min via INTRAVENOUS

## 2023-02-27 MED ORDER — EPINEPHRINE PF 1 MG/ML IJ SOLN
INTRAMUSCULAR | Status: AC
  Filled 2023-02-27: qty 1

## 2023-02-27 MED ORDER — ACETAMINOPHEN 325 MG PO TABS: 650.0000 mg | ORAL_TABLET | Freq: Three times a day (TID) | ORAL | 0 refills | Status: AC | PRN

## 2023-02-27 MED ORDER — FENTANYL CITRATE (PF) 100 MCG/2ML IJ SOLN
INTRAMUSCULAR | Status: AC
  Filled 2023-02-27: qty 2

## 2023-02-27 MED ORDER — GELATIN ABSORBABLE 12-7 MM EX MISC
CUTANEOUS | Status: AC
  Filled 2023-02-27: qty 1

## 2023-02-27 MED ORDER — PROPOFOL 10 MG/ML IV BOLUS
INTRAVENOUS | Status: AC
  Filled 2023-02-27: qty 20

## 2023-02-27 MED ORDER — OXYCODONE HCL 5 MG PO TABS
ORAL_TABLET | ORAL | Status: AC
  Filled 2023-02-27: qty 1

## 2023-02-27 MED ORDER — TRAMADOL HCL 50 MG PO TABS: 50.0000 mg | ORAL_TABLET | Freq: Every day | ORAL | 0 refills | Status: AC | PRN

## 2023-02-27 MED ORDER — BUPIVACAINE LIPOSOME 1.3 % IJ SUSP
INTRAMUSCULAR | Status: AC
  Filled 2023-02-27: qty 20

## 2023-02-27 MED ORDER — PROPOFOL 1000 MG/100ML IV EMUL
INTRAVENOUS | Status: AC
  Filled 2023-02-27: qty 100

## 2023-02-27 MED ORDER — LIDOCAINE 5 % EX OINT: 1.0000 | TOPICAL_OINTMENT | CUTANEOUS | 0 refills | Status: AC | PRN

## 2023-02-27 SURGICAL SUPPLY — 39 items
ADH SKN CLS APL DERMABOND .7 (GAUZE/BANDAGES/DRESSINGS) ×1
BLADE SURG 15 STRL LF DISP TIS (BLADE) ×1 IMPLANT
BLADE SURG 15 STRL SS (BLADE) ×1
BRIEF MESH DISP 2XL (UNDERPADS AND DIAPERS) ×1 IMPLANT
DERMABOND ADVANCED .7 DNX12 (GAUZE/BANDAGES/DRESSINGS) IMPLANT
DRAPE PERI LITHO V/GYN (MISCELLANEOUS) ×1 IMPLANT
DRAPE UNDER BUTTOCK W/FLU (DRAPES) ×1 IMPLANT
DRSG GAUZE FLUFF 36X18 (GAUZE/BANDAGES/DRESSINGS) ×1 IMPLANT
ELECT REM PT RETURN 9FT ADLT (ELECTROSURGICAL) ×1
ELECTRODE REM PT RTRN 9FT ADLT (ELECTROSURGICAL) ×1 IMPLANT
GAUZE 4X4 16PLY ~~LOC~~+RFID DBL (SPONGE) ×1 IMPLANT
GLOVE BIOGEL PI IND STRL 7.0 (GLOVE) ×1 IMPLANT
GLOVE SURG SYN 6.5 ES PF (GLOVE) ×1 IMPLANT
GLOVE SURG SYN 6.5 PF PI (GLOVE) ×1 IMPLANT
GOWN STRL REUS W/ TWL LRG LVL3 (GOWN DISPOSABLE) ×2 IMPLANT
GOWN STRL REUS W/TWL LRG LVL3 (GOWN DISPOSABLE) ×2
KIT TURNOVER CYSTO (KITS) ×1 IMPLANT
LABEL OR SOLS (LABEL) ×1 IMPLANT
MANIFOLD NEPTUNE II (INSTRUMENTS) ×1 IMPLANT
NDL HYPO 22X1.5 SAFETY MO (MISCELLANEOUS) ×1 IMPLANT
NDL HYPO 25X1 1.5 SAFETY (NEEDLE) ×1 IMPLANT
NEEDLE HYPO 22X1.5 SAFETY MO (MISCELLANEOUS) ×1 IMPLANT
NEEDLE HYPO 25X1 1.5 SAFETY (NEEDLE) ×1 IMPLANT
NS IRRIG 500ML POUR BTL (IV SOLUTION) ×1 IMPLANT
PACK BASIN MINOR ARMC (MISCELLANEOUS) ×1 IMPLANT
PAD PREP OB/GYN DISP 24X41 (PERSONAL CARE ITEMS) ×1 IMPLANT
SHEARS HARMONIC 9CM CVD (BLADE) IMPLANT
SOL PREP PVP 2OZ (MISCELLANEOUS) ×1
SOLUTION PREP PVP 2OZ (MISCELLANEOUS) ×1 IMPLANT
SURGILUBE 2OZ TUBE FLIPTOP (MISCELLANEOUS) ×1 IMPLANT
SUT MNCRL 4-0 (SUTURE) ×1
SUT MNCRL 4-0 27XMFL (SUTURE) ×1
SUT VIC AB 3-0 SH 27 (SUTURE) ×1
SUT VIC AB 3-0 SH 27X BRD (SUTURE) ×1 IMPLANT
SUTURE MNCRL 4-0 27XMF (SUTURE) IMPLANT
SWAB CULTURE AMIES ANAERIB BLU (MISCELLANEOUS) IMPLANT
SYR 10ML LL (SYRINGE) ×1 IMPLANT
SYR 20ML LL LF (SYRINGE) ×1 IMPLANT
TRAP FLUID SMOKE EVACUATOR (MISCELLANEOUS) ×1 IMPLANT

## 2023-02-27 NOTE — Anesthesia Preprocedure Evaluation (Signed)
Anesthesia Evaluation  Patient identified by MRN, date of birth, ID band Patient awake    Reviewed: Allergy & Precautions, NPO status , Patient's Chart, lab work & pertinent test results  History of Anesthesia Complications Negative for: history of anesthetic complications  Airway Mallampati: II  TM Distance: >3 FB Neck ROM: Full    Dental no notable dental hx. (+) Teeth Intact   Pulmonary asthma , neg sleep apnea, neg COPD, Patient abstained from smoking.Not current smoker Well controlled asthma, never hospitalized for it. Took his inhaler and nebulizer this morning as a precaution. Braething feels at baseline today   Pulmonary exam normal breath sounds clear to auscultation       Cardiovascular Exercise Tolerance: Good METS(-) hypertension(-) CAD and (-) Past MI negative cardio ROS (-) dysrhythmias  Rhythm:Regular Rate:Normal - Systolic murmurs    Neuro/Psych  Headaches PSYCHIATRIC DISORDERS Anxiety        GI/Hepatic ,GERD  Medicated and Controlled,,(+)     (-) substance abuse    Endo/Other  neg diabetes    Renal/GU negative Renal ROS     Musculoskeletal  (+) Arthritis ,    Abdominal   Peds  Hematology   Anesthesia Other Findings Past Medical History: 02/2023: Anal fistula No date: Anxiety No date: Arthritis No date: Barrett's esophagus No date: DDD (degenerative disc disease), lumbar No date: GERD (gastroesophageal reflux disease) No date: Hemorrhoids No date: History of kidney stones No date: Hyperlipidemia No date: Migraines No date: Pneumonia No date: Pre-diabetes No date: Severe persistent asthma No date: Vitamin D deficiency  Reproductive/Obstetrics                              Anesthesia Physical Anesthesia Plan  ASA: 2  Anesthesia Plan: General   Post-op Pain Management: Toradol IV (intra-op)* and Ofirmev IV (intra-op)*   Induction: Intravenous  PONV Risk  Score and Plan: 2 and Propofol infusion, TIVA, Ondansetron, Midazolam and Dexamethasone  Airway Management Planned: Nasal Cannula and Natural Airway  Additional Equipment: None  Intra-op Plan:   Post-operative Plan:   Informed Consent: I have reviewed the patients History and Physical, chart, labs and discussed the procedure including the risks, benefits and alternatives for the proposed anesthesia with the patient or authorized representative who has indicated his/her understanding and acceptance.     Dental advisory given  Plan Discussed with: CRNA and Surgeon  Anesthesia Plan Comments: (Discussed risks of anesthesia with patient, including possibility of difficulty with spontaneous ventilation under anesthesia necessitating airway intervention, PONV, and rare risks such as cardiac or respiratory or neurological events, and allergic reactions. Discussed the role of CRNA in patient's perioperative care. Patient understands.)         Anesthesia Quick Evaluation

## 2023-02-27 NOTE — Progress Notes (Signed)
   02/27/23 1200  Spiritual Encounters  Type of Visit Initial  Care provided to: Pt and family  Referral source Chaplain assessment  Reason for visit Routine spiritual support  OnCall Visit No  Spiritual Care Plan  Spiritual Care Issues Still Outstanding No further spiritual care needs at this time (see row info)   Chaplain visited with patient and family prior to surgery. Patient remarks of doing well. Pleasant mood and affect. Chaplain services available as need arises.

## 2023-02-27 NOTE — Discharge Instructions (Addendum)
Removal, Care After This sheet gives you information about how to care for yourself after your procedure. Your health care provider may also give you more specific instructions. If you have problems or questions, contact your health care provider. What can I expect after the procedure? After the procedure, it is common to have: Soreness. Bruising. Itching. Follow these instructions at home: site care Follow instructions from your health care provider about how to take care of your site. Make sure you: APPLY BACITRACIN TO SMALLER OPEN WOUND DAILY.  OK TO SHOWER IN 24HRS.  KEEP SKIN GLUE INTACT AROUND THE BIGGER CLOSED INCISION.  KEEP AREA CLEAN AS NEEDED WITH WET WIPES. KEEP AREA COVERED WITH GAUZE AS NEEDED OK TO HAVE SOME DISCHARGE FROM AREA.  CALL WITH ANY CONCERNS Wash your hands with soap and water before and after you change your bandage (dressing). If soap and water are not available, use hand sanitizer. Leave stitches (sutures), skin glue, or adhesive strips in place. These skin closures may need to stay in place for 2 weeks or longer. If adhesive strip edges start to loosen and curl up, you may trim the loose edges. Do not remove adhesive strips completely unless your health care provider tells you to do that. If the area bleeds or bruises, apply gentle pressure for 10 minutes. OK TO SHOWER IN 24HRS  Check your site every day for signs of infection. Check for: Redness, swelling, or pain. Fluid or blood. Warmth. Pus or a bad smell.  General instructions Rest and then return to your normal activities as told by your health care provider.  tylenol and mobic as needed for discomfort.      Use narcotics, if prescribed, only when tylenol is not enough to control pain.  325-650mg  every 8hrs to max of 3000mg /24hrs (including the 325mg  in every norco dose) for the tylenol.   Keep all follow-up visits as told by your health care provider. This is important. Contact a health care provider  if: You have redness, swelling, or pain around your site. You have fluid or blood coming from your site. Your site feels warm to the touch. You have pus or a bad smell coming from your site. You have a fever. Your sutures, skin glue, or adhesive strips loosen or come off sooner than expected. Get help right away if: You have bleeding that does not stop with pressure or a dressing. Summary After the procedure, it is common to have some soreness, bruising, and itching at the site. Follow instructions from your health care provider about how to take care of your site. Check your site every day for signs of infection. Contact a health care provider if you have redness, swelling, or pain around your site, or your site feels warm to the touch. Keep all follow-up visits as told by your health care provider. This is important. This information is not intended to replace advice given to you by your health care provider. Make sure you discuss any questions you have with your health care provider. Document Released: 09/25/2015 Document Revised: 02/26/2018 Document Reviewed: 02/26/2018 Elsevier Interactive Patient Education  2019 [employer]  AMBULATORY SURGERY  DISCHARGE INSTRUCTIONS   The drugs that you were given will stay in your system until tomorrow so for the next 24 hours you should not:  Drive an automobile Make any legal decisions Drink any alcoholic beverage   You may resume regular meals tomorrow.  Today it is better to start with liquids and gradually work up to  solid foods.  You may eat anything you prefer, but it is better to start with liquids, then soup and crackers, and gradually work up to solid foods.   Please notify your doctor immediately if you have any unusual bleeding, trouble breathing, redness and pain at the surgery site, drainage, fever, or pain not relieved by medication.    Your post-operative visit with Dr.                                       is:  Date:                        Time:    Please call to schedule your post-operative visit.  Additional Instructions:

## 2023-02-27 NOTE — Op Note (Addendum)
Preoperative diagnosis: Anal fistula Postoperative diagnosis: perianal lesion  Procedure: Exam under anesthesia, excisional biopsy of perianal lesion x2  Anesthesia: LMA  Surgeon: Tonna Boehringer  Wound Classification: Clean Contaminated  Specimen: None  Complications: None  EBL: 3 mL  Indications:  Patient is a 47 y.o. male with clinical exam concerning for an anal fistula.  Here today for formal exam under anesthesia.  See H&P for further details.  Description of procedure: The patient was taken to the operating room and placed in high lithotomy position.  LMA anesthesia was induced without any difficulty. A time-out was completed verifying correct patient, procedure, site, positioning, and implant(s) and/or special equipment prior to beginning this procedure.  Digital rectal exam noted no obvious indurated tissue or palpable fistulous tracts within the rectum.  He did have some grade 1 internal hemorrhoids.  Visualization within the lumen shortly afterwards confirmed the above findings.    The area of constant drainage in the 7 o'clock position approximately 5 to 6 cm away from the anal verge had minimal induration today with no obvious discharge.  Area essentially healed with minor irritation surrounding it.  Further exam noted identical but smaller lesion another 5cm or so lateral to area.  Decided to proceed with excisional biopsy since gross appearance did not look like typical healing abscess site.   After infusing local anesthesia surrounding the abscess site a 15 blade used to create elliptical incision around site. No additional tracking noted otherwise healthy subcutaneous tissue under the lesion.  The wound in the end measured approximately 1/2 cm x 3 cm.   Wound then closed with interrupted 4-0 monocryl.  Second more lateral lesion excised in smiliar matter, size measuring 5mm x 1cm.  These was even more superficial than initial lesion, so was covered with bacitracin only.   Both  specimen passed off field, pending pathology.  After confirming hemostasis, wound then cover with 4 x 4's fluffs, secured with mesh undergarments.  The patient tolerated the procedure well and  taken to the postoperative care unit in stable condition.  Sponge and instrument count was correct at the end of the procedure.

## 2023-02-27 NOTE — Transfer of Care (Signed)
Immediate Anesthesia Transfer of Care Note  Patient: Patrick Cobb  Procedure(s) Performed: EXAM UNDER ANESTHESIA with biopsy of anal lesion (Anus)  Patient Location: PACU  Anesthesia Type:General  Level of Consciousness: awake, drowsy, and patient cooperative  Airway & Oxygen Therapy: Patient Spontanous Breathing and Patient connected to face mask oxygen  Post-op Assessment: Report given to RN and Post -op Vital signs reviewed and stable  Post vital signs: Reviewed and stable  Last Vitals:  Vitals Value Taken Time  BP    Temp    Pulse    Resp    SpO2      Last Pain:  Vitals:   02/27/23 1044  TempSrc: Temporal  PainSc: 0-No pain         Complications: No notable events documented.

## 2023-02-27 NOTE — Interval H&P Note (Signed)
No change. OK to proceed.

## 2023-02-27 NOTE — Anesthesia Procedure Notes (Signed)
Procedure Name: MAC Date/Time: 02/27/2023 11:50 AM  Performed by: Elmarie Mainland, CRNAPre-anesthesia Checklist: Patient identified, Emergency Drugs available, Suction available and Patient being monitored Patient Re-evaluated:Patient Re-evaluated prior to induction Oxygen Delivery Method: Simple face mask

## 2023-02-27 NOTE — Anesthesia Postprocedure Evaluation (Signed)
Anesthesia Post Note  Patient: Patrick Cobb  Procedure(s) Performed: EXAM UNDER ANESTHESIA with biopsy of anal lesion (Anus)  Patient location during evaluation: PACU Anesthesia Type: General Level of consciousness: awake and alert Pain management: pain level controlled Vital Signs Assessment: post-procedure vital signs reviewed and stable Respiratory status: spontaneous breathing, nonlabored ventilation, respiratory function stable and patient connected to nasal cannula oxygen Cardiovascular status: blood pressure returned to baseline and stable Postop Assessment: no apparent nausea or vomiting Anesthetic complications: no   No notable events documented.   Last Vitals:  Vitals:   02/27/23 1315 02/27/23 1328  BP: (!) 96/49 106/75  Pulse: 63 67  Resp: 14 16  Temp: (!) 36.1 C 36.6 C  SpO2: 96% 98%    Last Pain:  Vitals:   02/27/23 1328  TempSrc: Temporal  PainSc: 2                  Corinda Gubler

## 2024-05-24 ENCOUNTER — Encounter: Payer: Self-pay | Admitting: *Deleted

## 2024-07-04 ENCOUNTER — Encounter: Payer: Self-pay | Admitting: *Deleted

## 2024-07-15 ENCOUNTER — Ambulatory Visit
Admission: RE | Admit: 2024-07-15 | Discharge: 2024-07-15 | Disposition: A | Discharge status: Home / Self Care | Attending: Gastroenterology | Admitting: Gastroenterology

## 2024-07-15 ENCOUNTER — Ambulatory Visit: Admitting: Anesthesiology

## 2024-07-15 ENCOUNTER — Encounter
Admission: RE | Disposition: A | Discharge status: Home / Self Care | Payer: Self-pay | Source: Home / Self Care | Attending: Gastroenterology

## 2024-07-15 ENCOUNTER — Other Ambulatory Visit: Payer: Self-pay

## 2024-07-15 DIAGNOSIS — Z79899 Other long term (current) drug therapy: Secondary | ICD-10-CM | POA: Insufficient documentation

## 2024-07-15 DIAGNOSIS — Z791 Long term (current) use of non-steroidal anti-inflammatories (NSAID): Secondary | ICD-10-CM | POA: Insufficient documentation

## 2024-07-15 DIAGNOSIS — J4550 Severe persistent asthma, uncomplicated: Secondary | ICD-10-CM | POA: Insufficient documentation

## 2024-07-15 DIAGNOSIS — K219 Gastro-esophageal reflux disease without esophagitis: Secondary | ICD-10-CM | POA: Diagnosis not present

## 2024-07-15 DIAGNOSIS — K2270 Barrett's esophagus without dysplasia: Secondary | ICD-10-CM | POA: Insufficient documentation

## 2024-07-15 DIAGNOSIS — Z83719 Family history of colon polyps, unspecified: Secondary | ICD-10-CM | POA: Insufficient documentation

## 2024-07-15 DIAGNOSIS — M1990 Unspecified osteoarthritis, unspecified site: Secondary | ICD-10-CM | POA: Insufficient documentation

## 2024-07-15 DIAGNOSIS — K640 First degree hemorrhoids: Secondary | ICD-10-CM | POA: Diagnosis not present

## 2024-07-15 DIAGNOSIS — Z1211 Encounter for screening for malignant neoplasm of colon: Secondary | ICD-10-CM | POA: Diagnosis present

## 2024-07-15 DIAGNOSIS — R519 Headache, unspecified: Secondary | ICD-10-CM | POA: Insufficient documentation

## 2024-07-15 HISTORY — PX: ESOPHAGOGASTRODUODENOSCOPY: SHX5428

## 2024-07-15 HISTORY — DX: Personal history of other diseases of the digestive system: Z87.19

## 2024-07-15 HISTORY — DX: Unspecified exotropia: H50.10

## 2024-07-15 HISTORY — DX: Other specified postprocedural states: Z98.890

## 2024-07-15 HISTORY — DX: Polyneuropathy in diseases classified elsewhere: G63

## 2024-07-15 HISTORY — PX: COLONOSCOPY: SHX5424

## 2024-07-15 HISTORY — DX: Localized edema: R60.0

## 2024-07-15 HISTORY — DX: Dizziness and giddiness: R42

## 2024-07-15 SURGERY — COLONOSCOPY
Anesthesia: General

## 2024-07-15 MED ORDER — PROPOFOL 10 MG/ML IV BOLUS
INTRAVENOUS | Status: AC
Start: 2024-07-15 — End: 2024-07-15
  Filled 2024-07-15: qty 40

## 2024-07-15 MED ORDER — PROPOFOL 1000 MG/100ML IV EMUL
INTRAVENOUS | Status: AC
Start: 2024-07-15 — End: 2024-07-15
  Filled 2024-07-15: qty 100

## 2024-07-15 MED ORDER — PROPOFOL 10 MG/ML IV BOLUS
INTRAVENOUS | Status: DC | PRN
  Administered 2024-07-15 (×2): 50 mg via INTRAVENOUS
  Administered 2024-07-15: 120 mg via INTRAVENOUS
  Administered 2024-07-15: 130 mg via INTRAVENOUS
  Administered 2024-07-15: 100 mg via INTRAVENOUS
  Administered 2024-07-15: 50 mg via INTRAVENOUS

## 2024-07-15 MED ORDER — SODIUM CHLORIDE 0.9 % IV SOLN: INTRAVENOUS | Status: DC

## 2024-07-15 MED ORDER — LIDOCAINE HCL (CARDIAC) PF 100 MG/5ML IV SOSY
PREFILLED_SYRINGE | INTRAVENOUS | Status: DC | PRN
  Administered 2024-07-15: 100 mg via INTRAVENOUS

## 2024-07-15 MED ORDER — STERILE WATER FOR IRRIGATION IR SOLN
Status: DC | PRN
  Administered 2024-07-15: 60 mL

## 2024-07-15 NOTE — Interval H&P Note (Signed)
 History and Physical Interval Note:  07/15/2024 7:48 AM  Patrick Cobb  has presented today for surgery, with the diagnosis of BARRETS ESOPHAGUS,FAMILY HX OF POLYP IN COLON.  The various methods of treatment have been discussed with the patient and family. After consideration of risks, benefits and other options for treatment, the patient has consented to  Procedure(s): COLONOSCOPY (N/A) EGD (ESOPHAGOGASTRODUODENOSCOPY) (N/A) as a surgical intervention.  The patient's history has been reviewed, patient examined, no change in status, stable for surgery.  I have reviewed the patient's chart and labs.  Questions were answered to the patient's satisfaction.     Patrick Cobb  Ok to proceed with EGD/Colonoscopy

## 2024-07-15 NOTE — Op Note (Signed)
 [facility] Patient Name: Patrick Cobb Procedure Date: 07/15/2024 7:21 AM MRN: [id number] Account #: [id number] Date of Birth: 02-04-1976 Admit Type: Outpatient Age: 48 Room: [facility] ENDO ROOM 3 Gender: Male Note Status: Finalized Instrument Name: Upper GI Scope [id number] Procedure:             Upper GI endoscopy Indications:           Barrett's esophagus Providers:             Ole Schick MD, MD Referring MD:          Ophelia Sage, MD (Referring MD) Medicines:             Monitored Anesthesia Care Complications:         No immediate complications. Estimated blood loss:                         Minimal. Procedure:             Pre-Anesthesia Assessment:                        - Prior to the procedure, a History and Physical was                         performed, and patient medications and allergies were                         reviewed. The patient is competent. The risks and                         benefits of the procedure and the sedation options and                         risks were discussed with the patient. All questions                         were answered and informed consent was obtained.                         Patient identification and proposed procedure were                         verified by the physician, the nurse, the                         anesthesiologist, the anesthetist and the technician                         in the endoscopy suite. Mental Status Examination:                         alert and oriented. Airway Examination: normal                         oropharyngeal airway and neck mobility. Respiratory                         Examination: clear to auscultation. CV Examination:  normal. Prophylactic Antibiotics: The patient does not                         require prophylactic antibiotics. Prior                         Anticoagulants: The patient has taken no anticoagulant                          or antiplatelet agents. ASA Grade Assessment: II - A                         patient with mild systemic disease. After reviewing                         the risks and benefits, the patient was deemed in                         satisfactory condition to undergo the procedure. The                         anesthesia plan was to use monitored anesthesia care                         (MAC). Immediately prior to administration of                         medications, the patient was re-assessed for adequacy                         to receive sedatives. The heart rate, respiratory                         rate, oxygen saturations, blood pressure, adequacy of                         pulmonary ventilation, and response to care were                         monitored throughout the procedure. The physical                         status of the patient was re-assessed after the                         procedure.                        After obtaining informed consent, the endoscope was                         passed under direct vision. Throughout the procedure,                         the patient's blood pressure, pulse, and oxygen                         saturations were monitored continuously. The Endoscope  was introduced through the mouth, and advanced to the                         second part of duodenum. The upper GI endoscopy was                         accomplished without difficulty. The patient tolerated                         the procedure well. Findings:      One tongue of salmon-colored mucosa was present. No other visible       abnormalities were present. The maximum longitudinal extent of these       esophageal mucosal changes was 1 cm in length. Biopsies were taken with       a cold forceps for histology. Estimated blood loss was minimal.      The entire examined stomach was normal.      The examined duodenum was normal. Impression:            - Salmon-colored  mucosa classified as Barrett's stage                         C1-M1 per Prague criteria. Biopsied.                        - Normal stomach.                        - Normal examined duodenum. Recommendation:        - Discharge patient to home.                        - Resume previous diet.                        - Continue present medications.                        - Await pathology results.                        - Return to referring physician as previously                         scheduled. Procedure Code(s):     --- Professional ---                        [phone], Esophagogastroduodenoscopy, flexible,                         transoral; with biopsy, single or multiple Diagnosis Code(s):     --- Professional ---                        K22.70, Barrett's esophagus without dysplasia CPT copyright 2022 American Medical Association. All rights reserved. The codes documented in this report are preliminary and upon coder review may  be revised to meet current compliance requirements. Ole Schick MD, MD 07/15/2024 8:21:30 AM Number of Addenda: 0 Note Initiated On: 07/15/2024 7:21 AM Estimated Blood Loss:  Estimated blood loss was minimal.      [place]  [facility]

## 2024-07-15 NOTE — Transfer of Care (Signed)
 Immediate Anesthesia Transfer of Care Note  Patient: Patrick Cobb  Procedure(s) Performed: COLONOSCOPY EGD (ESOPHAGOGASTRODUODENOSCOPY)  Patient Location: Endoscopy Unit  Anesthesia Type:General  Level of Consciousness: drowsy  Airway & Oxygen Therapy: Patient Spontanous Breathing  Post-op Assessment: Report given to RN, Post -op Vital signs reviewed and stable, and Patient moving all extremities  Post vital signs: Reviewed and stable  Last Vitals:  Vitals Value Taken Time  BP 110/65 07/15/24 08:18  Temp 35.8 C 07/15/24 08:18  Pulse 86 07/15/24 08:19  Resp 13 07/15/24 08:19  SpO2 95 % 07/15/24 08:19  Vitals shown include unfiled device data.  Last Pain:  Vitals:   07/15/24 0818  TempSrc: Temporal  PainSc: Asleep         Complications: No notable events documented.

## 2024-07-15 NOTE — Op Note (Signed)
 [facility] Patient Name: Patrick Cobb Procedure Date: 07/15/2024 7:21 AM MRN: [id number] Account #: [id number] Date of Birth: 1976/07/02 Admit Type: Outpatient Age: 48 Room: [facility] ENDO ROOM 3 Gender: Male Note Status: Finalized Instrument Name: Colon Scope [id number] Procedure:             Colonoscopy Indications:           Colon cancer screening in patient at increased risk:                         Family history of 1st-degree relative with colon polyps Providers:             Ole Schick MD, MD Referring MD:          Ophelia Sage, MD (Referring MD) Medicines:             Monitored Anesthesia Care Complications:         No immediate complications. Procedure:             Pre-Anesthesia Assessment:                        - Prior to the procedure, a History and Physical was                         performed, and patient medications and allergies were                         reviewed. The patient is competent. The risks and                         benefits of the procedure and the sedation options and                         risks were discussed with the patient. All questions                         were answered and informed consent was obtained.                         Patient identification and proposed procedure were                         verified by the physician, the nurse, the                         anesthesiologist, the anesthetist and the technician                         in the endoscopy suite. Mental Status Examination:                         alert and oriented. Airway Examination: normal                         oropharyngeal airway and neck mobility. Respiratory                         Examination: clear to auscultation. CV Examination:  normal. Prophylactic Antibiotics: The patient does not                         require prophylactic antibiotics. Prior                         Anticoagulants: The patient  has taken no anticoagulant                         or antiplatelet agents. ASA Grade Assessment: II - A                         patient with mild systemic disease. After reviewing                         the risks and benefits, the patient was deemed in                         satisfactory condition to undergo the procedure. The                         anesthesia plan was to use monitored anesthesia care                         (MAC). Immediately prior to administration of                         medications, the patient was re-assessed for adequacy                         to receive sedatives. The heart rate, respiratory                         rate, oxygen saturations, blood pressure, adequacy of                         pulmonary ventilation, and response to care were                         monitored throughout the procedure. The physical                         status of the patient was re-assessed after the                         procedure.                        After obtaining informed consent, the colonoscope was                         passed under direct vision. Throughout the procedure,                         the patient's blood pressure, pulse, and oxygen                         saturations were monitored continuously. The  Colonoscope was introduced through the anus and                         advanced to the the terminal ileum, with                         identification of the appendiceal orifice and IC                         valve. The colonoscopy was performed without                         difficulty. The patient tolerated the procedure well.                         The quality of the bowel preparation was good. The                         terminal ileum, ileocecal valve, appendiceal orifice,                         and rectum were photographed. Findings:      The perianal and digital rectal examinations were normal.      The terminal ileum  appeared normal.      Internal hemorrhoids were found during retroflexion. The hemorrhoids       were Grade I (internal hemorrhoids that do not prolapse).      The exam was otherwise without abnormality on direct and retroflexion       views. Impression:            - The examined portion of the ileum was normal.                        - Internal hemorrhoids.                        - The examination was otherwise normal on direct and                         retroflexion views.                        - No specimens collected. Recommendation:        - Discharge patient to home.                        - Resume previous diet.                        - Continue present medications.                        - Repeat colonoscopy in 10 years for screening                         purposes.                        - Return to referring physician as previously  scheduled. Procedure Code(s):     --- Professional ---                        H9894, Colorectal cancer screening; colonoscopy on                         individual at high risk Diagnosis Code(s):     --- Professional ---                        Z83.71, Family history of colonic polyps                        K64.0, First degree hemorrhoids CPT copyright 2022 American Medical Association. All rights reserved. The codes documented in this report are preliminary and upon coder review may  be revised to meet current compliance requirements. Ole Schick MD, MD 07/15/2024 8:29:05 AM Number of Addenda: 0 Note Initiated On: 07/15/2024 7:21 AM Scope Withdrawal Time: 0 hours 7 minutes 13 seconds  Total Procedure Duration: 0 hours 10 minutes 10 seconds  Estimated Blood Loss:  Estimated blood loss: none.      [facility]

## 2024-07-15 NOTE — H&P (Signed)
 Outpatient short stay form Pre-procedure 07/15/2024  Ole ONEIDA Schick, MD  Primary Physician: Fernande Ophelia JINNY DOUGLAS, MD  Reason for visit:  Barrett's Esophagus/Screening  History of present illness:    48 y/o gentleman with history of arthritis, asthma, and BE here for EGD for barret's esophagus and colonoscopy for family history of polyps. No blood thinners. No first degree relatives with colon cancer. No abdominal surgeries.    Current Facility-Administered Medications:    0.9 %  sodium chloride  infusion, , Intravenous, Continuous, Carlos Quackenbush, Ole ONEIDA, MD, Last Rate: 20 mL/hr at 07/15/24 0737, New Bag at 07/15/24 0737  Facility-Administered Medications Ordered in Other Encounters:    methacholine  (PROVOCHOLINE ) 0 mg/33mL (0 mg/mL) inhaler solution 3 mL, 3 mL, Nebulization, Once **FOLLOWED BY** methacholine  (PROVOCHOLINE ) 0.1875 mg/3mL (0.0625 mg/mL) inhaler solution 0.1875 mg, 3 mL, Inhalation, Once **FOLLOWED BY** methacholine  (PROVOCHOLINE ) 0.75 mg/3mL (0.25 mg/mL) inhaler solution 0.75 mg, 3 mL, Inhalation, Once **FOLLOWED BY** methacholine  (PROVOCHOLINE ) 3 mg/3mL (1 mg/mL) inhaler solution 3 mg, 3 mL, Inhalation, Once **FOLLOWED BY** methacholine  (PROVOCHOLINE ) 12 mg/53mL (4 mg/mL) inhaler solution 12 mg, 3 mL, Inhalation, Once **FOLLOWED BY** methacholine  (PROVOCHOLINE ) 48 mg/3mL (16 mg/mL) inhaler solution 48 mg, 3 mL, Inhalation, Once **FOLLOWED BY** albuterol  (PROVENTIL ) (2.5 MG/3ML) 0.083% nebulizer solution 2.5 mg, 2.5 mg, Nebulization, Once, Aleskerov, Fuad, MD   [COMPLETED] methacholine  (PROVOCHOLINE ) 0 mg/3mL (0 mg/mL) inhaler solution 3 mL, 3 mL, Nebulization, Once, 3 mL at 01/26/22 0826 **FOLLOWED BY** [COMPLETED] methacholine  (PROVOCHOLINE ) 0.1875 mg/79mL (0.0625 mg/mL) inhaler solution 0.1875 mg, 3 mL, Inhalation, Once, 0.1875 mg at 01/26/22 0831 **FOLLOWED BY** [COMPLETED] methacholine  (PROVOCHOLINE ) 0.75 mg/46mL (0.25 mg/mL) inhaler solution 0.75 mg, 3 mL, Inhalation, Once, 0.75 mg  at 01/26/22 0836 **FOLLOWED BY** [COMPLETED] methacholine  (PROVOCHOLINE ) 3 mg/3mL (1 mg/mL) inhaler solution 3 mg, 3 mL, Inhalation, Once, 3 mg at 01/26/22 0841 **FOLLOWED BY** methacholine  (PROVOCHOLINE ) 12 mg/24mL (4 mg/mL) inhaler solution 12 mg, 3 mL, Inhalation, Once **FOLLOWED BY** methacholine  (PROVOCHOLINE ) 48 mg/38mL (16 mg/mL) inhaler solution 48 mg, 3 mL, Inhalation, Once **FOLLOWED BY** [COMPLETED] albuterol  (PROVENTIL ) (2.5 MG/3ML) 0.083% nebulizer solution 2.5 mg, 2.5 mg, Nebulization, Once, Aleskerov, Fuad, MD, 2.5 mg at 01/26/22 0849  Medications Prior to Admission  Medication Sig Dispense Refill Last Dose/Taking   AJOVY 225 MG/1.5ML SOAJ Inject 225 mg into the skin every 30 (thirty) days.   Past Month   Ascorbic Acid (VITAMIN C) 1000 MG tablet Take 1,000 mg by mouth daily.   Past Week   Cholecalciferol (VITAMIN D3) 50 MCG (2000 UT) TABS Take 1 tablet by mouth daily.   Past Week   DUPIXENT 300 MG/2ML SOPN Inject 300 mg into the skin every 14 (fourteen) days.   Past Week   Fluticasone-Umeclidin-Vilant (TRELEGY ELLIPTA) 200-62.5-25 MCG/ACT AEPB Inhale 1 Inhalation into the lungs daily.   07/14/2024   gabapentin (NEURONTIN) 300 MG capsule Take 600 mg by mouth 2 (two) times daily.   07/14/2024   Magnesium  400 MG TABS Take 1 tablet by mouth daily.   07/14/2024   meloxicam (MOBIC) 15 MG tablet Take 15 mg by mouth daily.   07/14/2024   Multiple Vitamin (MULTI-VITAMIN) tablet Take 1 tablet by mouth daily.   07/14/2024   omeprazole (PRILOSEC) 40 MG capsule Take 40 mg by mouth daily.   07/14/2024   sertraline (ZOLOFT) 50 MG tablet Take 50 mg by mouth daily.   07/14/2024   SUMAtriptan (IMITREX) 100 MG tablet Take 100 mg by mouth every 2 (two) hours as needed for migraine.   Past  Month   topiramate (TOPAMAX) 25 MG tablet Take 25 mg by mouth daily.   07/14/2024   topiramate (TOPAMAX) 50 MG tablet Take 50 mg by mouth at bedtime.   07/14/2024   traMADol  (ULTRAM ) 50 MG tablet Take 1 tablet (50 mg total) by  mouth daily as needed. 20 tablet 0 07/14/2024   Zinc  50 MG TABS Take 1 tablet by mouth daily.   07/14/2024   albuterol  (PROVENTIL ) (2.5 MG/3ML) 0.083% nebulizer solution Take 2.5 mg by nebulization every 6 (six) hours as needed for wheezing or shortness of breath.      albuterol  (VENTOLIN  HFA) 108 (90 Base) MCG/ACT inhaler Inhale 2 puffs into the lungs every 6 (six) hours as needed for wheezing or shortness of breath.      lidocaine  (XYLOCAINE ) 5 % ointment Apply 1 Application topically as needed. 35.44 g 0    prochlorperazine (COMPAZINE) 10 MG tablet Take 10 mg by mouth every 8 (eight) hours as needed.        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    Physical Exam  Gen: Alert, oriented. Appears stated age.  HEENT: PERRLA. Lungs: No respiratory distress CV: RRR Abd: soft, benign, no masses Ext: No edema    Planned procedures: Proceed with EGD/colonoscopy. The patient understands the nature of the planned procedure, indications, risks, alternatives and potential complications including but not limited to bleeding, infection, perforation, damage to internal organs and possible oversedation/side effects from anesthesia. The patient agrees and gives consent to proceed.  Please refer to procedure notes for findings, recommendations and patient  disposition/instructions.     Ole ONEIDA Schick, MD [facility]

## 2024-07-15 NOTE — Anesthesia Preprocedure Evaluation (Signed)
 Anesthesia Evaluation  Patient identified by MRN, date of birth, ID band Patient awake    Reviewed: Allergy & Precautions, NPO status , Patient's Chart, lab work & pertinent test results  History of Anesthesia Complications Negative for: history of anesthetic complications  Airway Mallampati: III  TM Distance: >3 FB Neck ROM: full    Dental no notable dental hx.    Pulmonary asthma    Pulmonary exam normal        Cardiovascular negative cardio ROS Normal cardiovascular exam     Neuro/Psych  Headaches PSYCHIATRIC DISORDERS Anxiety      Neuromuscular disease    GI/Hepatic Neg liver ROS,GERD  Medicated,,  Endo/Other  negative endocrine ROS    Renal/GU negative Renal ROS  negative genitourinary   Musculoskeletal   Abdominal   Peds  Hematology negative hematology ROS (+)   Anesthesia Other Findings Past Medical History: 02/2023: Anal fistula No date: Anxiety No date: Arthritis No date: Barrett's esophagus No date: Bilateral leg edema No date: DDD (degenerative disc disease), lumbar No date: Exotropia No date: GERD (gastroesophageal reflux disease) No date: Hemorrhoids No date: History of hemorrhoids No date: History of kidney stones No date: Hx of tympanostomy tubes No date: Hyperlipidemia No date: Migraines No date: Pneumonia No date: Polyneuropathy associated with underlying disease No date: Pre-diabetes No date: Recurrent vertigo No date: Severe persistent asthma (HCC) No date: Vitamin D deficiency  Past Surgical History: 2019: COLONOSCOPY WITH ESOPHAGOGASTRODUODENOSCOPY (EGD) 10/20/2021: ESOPHAGOGASTRODUODENOSCOPY 12/19/2019: EXTRACORPOREAL SHOCK WAVE LITHOTRIPSY; Right     Comment:  Procedure: EXTRACORPOREAL SHOCK WAVE LITHOTRIPSY (ESWL);              Surgeon: Penne Knee, MD;  Location: [facility] ORS;                Service: Urology;  Laterality: Right; No date: MYRINGOTOMY WITH TUBE PLACEMENT      Comment:  child 10/31/2017: SHOULDER ARTHROSCOPY WITH DISTAL CLAVICLE RESECTION; Right     Comment:  Procedure: SHOULDER ARTHROSCOPY WITH DISTAL CLAVICLE               EXCISION, SUBACROMIAL DECOMPRESSION, EXTENSIVE               DEBRIDEMENT;  Surgeon: Tobie Priest, MD;  Location:               [facility];  Service: Orthopedics;  Laterality:               Right;  MAC W INTERSCALENE BLOCK BEACH CHAIR W/               SPYDER SIMPLE SLING 2005: STRABISMUS SURGERY; Bilateral No date: TONSILLECTOMY 2012: URETEROSCOPY WITH HOLMIUM LASER LITHOTRIPSY     Reproductive/Obstetrics negative OB ROS                              Anesthesia Physical Anesthesia Plan  ASA: 2  Anesthesia Plan: General   Post-op Pain Management:    Induction: Intravenous  PONV Risk Score and Plan: 1 and Propofol  infusion and TIVA  Airway Management Planned: Natural Airway and Nasal Cannula  Additional Equipment:   Intra-op Plan:   Post-operative Plan:   Informed Consent: I have reviewed the patients History and Physical, chart, labs and discussed the procedure including the risks, benefits and alternatives for the proposed anesthesia with the patient or authorized representative who has indicated his/her understanding and acceptance.     Dental Advisory Given  Plan  Discussed with: Anesthesiologist, CRNA and Surgeon  Anesthesia Plan Comments: (Patient consented for risks of anesthesia including but not limited to:  - adverse reactions to medications - risk of airway placement if required - damage to eyes, teeth, lips or other oral mucosa - nerve damage due to positioning  - sore throat or hoarseness - Damage to heart, brain, nerves, lungs, other parts of body or loss of life  Patient voiced understanding and assent.)         Anesthesia Quick Evaluation

## 2024-07-15 NOTE — Anesthesia Postprocedure Evaluation (Signed)
 Anesthesia Post Note  Patient: Patrick Cobb  Procedure(s) Performed: COLONOSCOPY EGD (ESOPHAGOGASTRODUODENOSCOPY)  Patient location during evaluation: Endoscopy Anesthesia Type: General Level of consciousness: awake and alert Pain management: pain level controlled Vital Signs Assessment: post-procedure vital signs reviewed and stable Respiratory status: spontaneous breathing, nonlabored ventilation, respiratory function stable and patient connected to nasal cannula oxygen Cardiovascular status: blood pressure returned to baseline and stable Postop Assessment: no apparent nausea or vomiting Anesthetic complications: no   No notable events documented.   Last Vitals:  Vitals:   07/15/24 0846 07/15/24 0847  BP: 116/80 125/82  Pulse: 72 68  Resp: (!) 22 17  Temp:    SpO2: 99% 99%    Last Pain:  Vitals:   07/15/24 0827  TempSrc:   PainSc: 0-No pain                 Lendia LITTIE Mae

## 2024-07-16 LAB — SURGICAL PATHOLOGY
